# Patient Record
Sex: Male | Born: 2000 | Race: White | Hispanic: No | Marital: Single | Smoking: Current every day smoker
Health system: Southern US, Community
[De-identification: ages and names within clinical notes are randomized; demographics above are authoritative.]

## PROBLEM LIST (undated history)

## (undated) DIAGNOSIS — F419 Anxiety disorder, unspecified: Secondary | ICD-10-CM

## (undated) HISTORY — PX: TONSILLECTOMY: SUR1361

---
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
## 2023-05-19 ENCOUNTER — Emergency Department (HOSPITAL_COMMUNITY)
Admission: EM | Admit: 2023-05-19 | Discharge: 2023-05-19 | Disposition: A | Discharge status: Home / Self Care | Payer: Medicaid Other | Attending: Emergency Medicine | Admitting: Emergency Medicine

## 2023-05-19 ENCOUNTER — Other Ambulatory Visit: Payer: Self-pay

## 2023-05-19 DIAGNOSIS — T50901A Poisoning by unspecified drugs, medicaments and biological substances, accidental (unintentional), initial encounter: Secondary | ICD-10-CM

## 2023-05-19 DIAGNOSIS — R464 Slowness and poor responsiveness: Secondary | ICD-10-CM | POA: Insufficient documentation

## 2023-05-19 DIAGNOSIS — R7309 Other abnormal glucose: Secondary | ICD-10-CM | POA: Insufficient documentation

## 2023-05-19 DIAGNOSIS — M79676 Pain in unspecified toe(s): Secondary | ICD-10-CM | POA: Diagnosis not present

## 2023-05-19 DIAGNOSIS — R519 Headache, unspecified: Secondary | ICD-10-CM | POA: Diagnosis not present

## 2023-05-19 DIAGNOSIS — R4182 Altered mental status, unspecified: Secondary | ICD-10-CM | POA: Diagnosis present

## 2023-05-19 LAB — CBG MONITORING, ED: Glucose-Capillary: 107 mg/dL — ABNORMAL HIGH (ref 70–99)

## 2023-05-19 NOTE — Discharge Instructions (Signed)
Please avoid using any recreational drugs as it is harmful for your health.  Use resource below for outpatient care.

## 2023-05-19 NOTE — ED Provider Notes (Signed)
[place] EMERGENCY DEPARTMENT AT [facility] Provider Note   CSN: [id number] Arrival date & time: 05/19/23  2105     History  Chief Complaint  Patient presents with   Altered Mental Status    William Macdonald is a 22 y.o. male.  The history is provided by the patient and the EMS personnel. No language interpreter was used.  Altered Mental Status    22 year old male brought in to the ER via EMS for evaluation of altered mental status.  Patient was found unresponsive at an apartment and when found, he was having agonal breathing.  Patient received 2 mg of Narcan and became more responsive however he was noted to be hypoxic with O2 sats in the 80s requiring supplemental oxygen.  Patient brought to the ER and became much more responsive.  At this time he is doing much better.  Patient admits that he may have intentionally overdosed on drugs.  States that he was smoking marijuana that was given to him by another person which he felt may have been laced with something.  He denies intentionally tried to harm himself.  He denies IV drug use.  Aside from some mild headache and some toe pain.  Denies any active chest pain or shortness of breath denies abdominal pain no trouble with hallucination.  Home Medications Prior to Admission medications   Not on File      Allergies    Patient has no allergy information on record.    Review of Systems   Review of Systems  All other systems reviewed and are negative.   Physical Exam Updated Vital Signs BP (!) 123/91 (BP Location: Right Arm)   Temp 97.7 F (36.5 C) (Oral)   Resp 18   SpO2 (!) 82% Comment: patient placed on  2L Physical Exam Vitals and nursing note reviewed.  Constitutional:      General: He is not in acute distress.    Appearance: He is well-developed.     Comments: Disheveled in appearance however in no acute discomfort.  HENT:     Head: Atraumatic.  Eyes:     Conjunctiva/sclera: Conjunctivae normal.   Cardiovascular:     Rate and Rhythm: Normal rate and regular rhythm.     Pulses: Normal pulses.     Heart sounds: Normal heart sounds.  Pulmonary:     Effort: Pulmonary effort is normal.     Breath sounds: Normal breath sounds.  Abdominal:     Palpations: Abdomen is soft.  Musculoskeletal:     Cervical back: Neck supple.  Skin:    Findings: No rash.  Neurological:     Mental Status: He is alert. Mental status is at baseline.  Psychiatric:        Mood and Affect: Mood normal.     ED Results / Procedures / Treatments   Labs (all labs ordered are listed, but only abnormal results are displayed) Labs Reviewed  CBG MONITORING, ED - Abnormal; Notable for the following components:      Result Value   Glucose-Capillary 107 (*)    All other components within normal limits    EKG None  Radiology No results found.  Procedures Procedures    Medications Ordered in ED Medications - No data to display  ED Course/ Medical Decision Making/ A&P                             Medical Decision Making  BP (!) 123/91 (BP Location: Right Arm)   Temp 97.7 F (36.5 C) (Oral)   Resp 18   SpO2 (!) 82% Comment: patient placed on [place] 2L  24:20 PM  22 year old male brought in to the ER via EMS for evaluation of altered mental status.  Patient was found unresponsive at an apartment and when found, he was having agonal breathing.  Patient received 2 mg of Narcan and became more responsive however he was noted to be hypoxic with O2 sats in the 80s requiring supplemental oxygen.  Patient brought to the ER and became much more responsive.  At this time he is doing much better.  Patient admits that he may have intentionally overdosed on drugs.  States that he was smoking marijuana that was given to him by another person which he felt may have been laced with something.  He denies intentionally tried to harm himself.  He denies IV drug use.  Aside from some mild headache and some toe pain.  Denies  any active chest pain or shortness of breath denies abdominal pain no trouble with hallucination.  Initially when patient brought and, his O2 sat was at 82% however after receiving Narcan from EMS, he became a lot more alert and oriented.  He went to the bathroom to shower and my examination, he is now resting comfortably in the chair appears to be in no acute discomfort.  Head is atraumatic and normocephalic, lungs are clear to auscultation bilaterally abdomen soft nontender, patient is moving all 4 extremities with equal effort.  He is alert and oriented  Patient appears more towards his baseline.  Mentating appropriately.  He admits he intentionally used marijuana that may have been laced with other drugs.  Suspect fentanyl.  Denies intentional self-harm.  Vital sign recheck and O2 sats at 97% on room air.  11:13 PM Patient has been monitored for the past several hours with improvement of his symptoms.  At this time he is stable for discharge.        Final Clinical Impression(s) / ED Diagnoses Final diagnoses:  Accidental overdose, initial encounter    Rx / DC Orders ED Discharge Orders     None         Fayrene Helper, PA-C 05/19/23 2314    Glyn Ade, MD 05/20/23 1452

## 2023-05-19 NOTE — ED Triage Notes (Signed)
Patient was found unresponsive at some apartments off of [address], agonal breathing 4 breaths/minute upon EMS arrival, patient received Narcan 2mg  IN, patient was responsive after Narcan O2 remained in the 80s and he required O2 via [place]  at 2L

## 2023-07-08 ENCOUNTER — Emergency Department (HOSPITAL_COMMUNITY)
Admission: EM | Admit: 2023-07-08 | Discharge: 2023-07-08 | Disposition: A | Discharge status: Home / Self Care | Payer: Medicaid Other | Attending: Emergency Medicine | Admitting: Emergency Medicine

## 2023-07-08 DIAGNOSIS — M79672 Pain in left foot: Secondary | ICD-10-CM | POA: Diagnosis not present

## 2023-07-08 DIAGNOSIS — M79671 Pain in right foot: Secondary | ICD-10-CM | POA: Insufficient documentation

## 2023-07-08 MED ORDER — IBUPROFEN 200 MG PO TABS
600.0000 mg | ORAL_TABLET | Freq: Once | ORAL | Status: AC
  Administered 2023-07-08: 600 mg via ORAL
  Filled 2023-07-08: qty 3

## 2023-07-08 MED ORDER — CEPHALEXIN 500 MG PO CAPS
500.0000 mg | ORAL_CAPSULE | Freq: Once | ORAL | Status: AC
  Administered 2023-07-08: 500 mg via ORAL
  Filled 2023-07-08: qty 1

## 2023-07-08 MED ORDER — CEPHALEXIN 500 MG PO CAPS: 500.0000 mg | ORAL_CAPSULE | Freq: Four times a day (QID) | ORAL | 0 refills | Status: AC

## 2023-07-08 NOTE — Discharge Instructions (Addendum)
Your exam was reassuring.  Continue taking Tylenol and ibuprofen as you need to for pain control.  I have given you a prescription for antibiotics.  Follow-up with your primary care provider.

## 2023-07-08 NOTE — ED Provider Notes (Signed)
[place] EMERGENCY DEPARTMENT AT [facility] Provider Note   CSN: [id number] Arrival date & time: 07/08/23  4098     History  Chief Complaint  Patient presents with   Foot Pain    Pt reports maggots in feet, EMS report on assessment pt had no open wounds or maggots,  but notes irritation.       William Macdonald is a 22 y.o. male.  22 year old homeless male presents with EMS for concern of bilateral foot pain.  He states he has been walking a lot over the past few days.  Has been wearing shoes.  Denies any fever or drainage.  His feet are red.  No open wounds.  Reported maggots to EMS however the maggots on either feet.  He is requesting food and drink.  States he has not had any good sleep in the past few nights either.  Denies SI, or HI.  The history is provided by the patient. No language interpreter was used.       Home Medications Prior to Admission medications   Medication Sig Start Date End Date Taking? Authorizing Provider  cephALEXin (KEFLEX) 500 MG capsule Take 1 capsule (500 mg total) by mouth 4 (four) times daily for 7 days. 07/08/23 07/15/23 Yes Riki Berninger, PA-C      Allergies    Zyprexa [olanzapine]    Review of Systems   Review of Systems  Constitutional:  Negative for fever.  Skin:  Positive for wound.  All other systems reviewed and are negative.   Physical Exam Updated Vital Signs BP 135/60 (BP Location: Right Arm)   Pulse 81   Temp 97.9 F (36.6 C) (Oral)   Resp 18   SpO2 98%  Physical Exam Vitals and nursing note reviewed.  Constitutional:      General: He is not in acute distress.    Appearance: Normal appearance. He is not ill-appearing.  HENT:     Head: Normocephalic and atraumatic.     Nose: Nose normal.  Eyes:     Conjunctiva/sclera: Conjunctivae normal.  Pulmonary:     Effort: Pulmonary effort is normal. No respiratory distress.  Musculoskeletal:        General: No deformity.     Comments: Full range of motion in  bilateral lower extremities including in bilateral hips, knees, as well as ankles.  Erythema noted to bilateral feet.  No open sores or wounds.  No maggots.  Neurovascularly intact.  Patient ambulated in the room as well as to the bathroom without any difficulty.  Skin:    Findings: No rash.  Neurological:     Mental Status: He is alert.     ED Results / Procedures / Treatments   Labs (all labs ordered are listed, but only abnormal results are displayed) Labs Reviewed - No data to display  EKG None  Radiology No results found.  Procedures Procedures    Medications Ordered in ED Medications  cephALEXin (KEFLEX) capsule 500 mg (500 mg Oral Given 07/08/23 0335)  ibuprofen (ADVIL) tablet 600 mg (600 mg Oral Given 07/08/23 0335)    ED Course/ Medical Decision Making/ A&P                             Medical Decision Making Risk OTC drugs. Prescription drug management.   22 year old male presented with concern for bilateral foot pain after walking quite a bit over the past few days.  He is homeless.  Weather has been extremely hot.  He was wearing sneakers.  Exam is overall reassuring.  Neurovascularly intact.  Keflex prescribed.  Initial dose given in the ED.  He is requesting food.  No other complaints.  He is appropriate for discharge.  Denies SI or HI.   Final Clinical Impression(s) / ED Diagnoses Final diagnoses:  Bilateral foot pain    Rx / DC Orders ED Discharge Orders          Ordered    cephALEXin (KEFLEX) 500 MG capsule  4 times daily        07/08/23 0554              Marita Kansas, PA-C 07/08/23 0556    Tilden Fossa, MD 07/08/23 [phone]

## 2023-09-24 ENCOUNTER — Emergency Department
Admission: EM | Admit: 2023-09-24 | Discharge: 2023-09-24 | Disposition: A | Discharge status: Home / Self Care | Payer: Medicaid Other | Attending: Emergency Medicine | Admitting: Emergency Medicine

## 2023-09-24 ENCOUNTER — Other Ambulatory Visit: Payer: Self-pay

## 2023-09-24 DIAGNOSIS — F10921 Alcohol use, unspecified with intoxication delirium: Secondary | ICD-10-CM | POA: Diagnosis not present

## 2023-09-24 DIAGNOSIS — R4182 Altered mental status, unspecified: Secondary | ICD-10-CM | POA: Diagnosis present

## 2023-09-24 DIAGNOSIS — Y908 Blood alcohol level of 240 mg/100 ml or more: Secondary | ICD-10-CM | POA: Diagnosis not present

## 2023-09-24 LAB — COMPREHENSIVE METABOLIC PANEL
ALT: 11 U/L (ref 0–44)
AST: 23 U/L (ref 15–41)
Albumin: 3.5 g/dL (ref 3.5–5.0)
Alkaline Phosphatase: 125 U/L (ref 38–126)
Anion gap: 12 (ref 5–15)
BUN: 8 mg/dL (ref 6–20)
CO2: 20 mmol/L — ABNORMAL LOW (ref 22–32)
Calcium: 8 mg/dL — ABNORMAL LOW (ref 8.9–10.3)
Chloride: 102 mmol/L (ref 98–111)
Creatinine, Ser: 0.53 mg/dL — ABNORMAL LOW (ref 0.61–1.24)
GFR, Estimated: >60 mL/min (ref >60)
Glucose, Bld: 122 mg/dL — ABNORMAL HIGH (ref 70–99)
Potassium: 3.7 mmol/L (ref 3.5–5.1)
Sodium: 134 mmol/L — ABNORMAL LOW (ref 135–145)
Total Bilirubin: 0.6 mg/dL (ref 0.3–1.2)
Total Protein: 6.1 g/dL — ABNORMAL LOW (ref 6.5–8.1)

## 2023-09-24 LAB — URINE DRUG SCREEN, QUALITATIVE (ARMC ONLY)
Amphetamines, Ur Screen: NOT DETECTED
Barbiturates, Ur Screen: NOT DETECTED
Benzodiazepine, Ur Scrn: NOT DETECTED
Cannabinoid 50 Ng, Ur ~~LOC~~: POSITIVE — AB
Cocaine Metabolite,Ur ~~LOC~~: NOT DETECTED
MDMA (Ecstasy)Ur Screen: NOT DETECTED
Methadone Scn, Ur: NOT DETECTED
Opiate, Ur Screen: NOT DETECTED
Phencyclidine (PCP) Ur S: NOT DETECTED
Tricyclic, Ur Screen: NOT DETECTED

## 2023-09-24 LAB — CBC
HCT: 39.6 % (ref 39.0–52.0)
Hemoglobin: 13.1 g/dL (ref 13.0–17.0)
MCH: 28.3 pg (ref 26.0–34.0)
MCHC: 33.1 g/dL (ref 30.0–36.0)
MCV: 85.5 fL (ref 80.0–100.0)
Platelets: 194 10*3/uL (ref 150–400)
RBC: 4.63 MIL/uL (ref 4.22–5.81)
RDW: 13.4 % (ref 11.5–15.5)
WBC: 6.8 10*3/uL (ref 4.0–10.5)
nRBC: 0 % (ref 0.0–0.2)

## 2023-09-24 LAB — ACETAMINOPHEN LEVEL: Acetaminophen (Tylenol), Serum: <10 ug/mL — ABNORMAL LOW (ref 10–30)

## 2023-09-24 LAB — ETHANOL: Alcohol, Ethyl (B): 285 mg/dL — ABNORMAL HIGH (ref <10)

## 2023-09-24 LAB — SALICYLATE LEVEL: Salicylate Lvl: <7 mg/dL — ABNORMAL LOW (ref 7.0–30.0)

## 2023-09-24 MED ORDER — SODIUM CHLORIDE 0.9 % IV BOLUS
1000.0000 mL | Freq: Once | INTRAVENOUS | Status: AC
  Administered 2023-09-24: 1000 mL via INTRAVENOUS

## 2023-09-24 NOTE — ED Provider Notes (Signed)
-----------------------------------------   8:58 PM on 09/24/2023 -----------------------------------------  I took over care of this patient from Dr. Vicente Males.  On reassessment, the patient is alert and oriented, walking with steady gait, clinically sober.  He does endorse alcohol use earlier.  UDS is positive only for cannabinol's.  Lab workup is unremarkable.  The patient is stable for discharge home at this time.  Return precautions given, and he expresses understanding.   Dionne Bucy, MD 09/24/23 [phone]

## 2023-09-24 NOTE — ED Notes (Signed)
Patient awake, alert oriented, walking without difficulty, siding at the bedside eating a lunch tray

## 2023-09-24 NOTE — ED Triage Notes (Signed)
Pt arrived via EMS. Pt is having altered mental status. Pt was found near a public bathroom covered in vomit. Pt smells of ETOH. Pt is A/Ox1 at this time.

## 2023-09-24 NOTE — ED Provider Notes (Signed)
[facility] Provider Note   Event Date/Time   First MD Initiated Contact with Patient 09/24/23 1336     (approximate) History  Altered Mental Status  HPI William Macdonald is a 22 y.o. male with unknown past medical history who presents via EMS for altered mental status after being found down near a public restroom covered in vomit.  EMS notes patient smells of EtOH and is only alert and oriented to self at this time ROS: Unable to assess   Physical Exam  Triage Vital Signs: ED Triage Vitals  Encounter Vitals Group     BP 09/24/23 1338 112/64     Systolic BP Percentile --      Diastolic BP Percentile --      Pulse Rate 09/24/23 1338 (!) 59     Resp 09/24/23 1338 14     Temp 09/24/23 1338 98.4 F (36.9 C)     Temp Source 09/24/23 1338 Oral     SpO2 09/24/23 1338 100 %     Weight 09/24/23 1335 151 lb (68.5 kg)     Height --      Head Circumference --      Peak Flow --      Pain Score 09/24/23 1334 0     Pain Loc --      Pain Education --      Exclude from Growth Chart --    Most recent vital signs: Vitals:   09/24/23 1338  BP: 112/64  Pulse: (!) 59  Resp: 14  Temp: 98.4 F (36.9 C)  SpO2: 100%   General: Asleep on stretcher and awakened with sternal rub CV:  Good peripheral perfusion.  Resp:  Normal effort.  Abd:  No distention.  Other:  Young adult well-developed, well-nourished Caucasian male resting in stretcher.  GCS 9  ED Results / Procedures / Treatments  Labs (all labs ordered are listed, but only abnormal results are displayed) Labs Reviewed  COMPREHENSIVE METABOLIC PANEL - Abnormal; Notable for the following components:      Result Value   Sodium 134 (*)    CO2 20 (*)    Glucose, Bld 122 (*)    Creatinine, Ser 0.53 (*)    Calcium 8.0 (*)    Total Protein 6.1 (*)    All other components within normal limits  ETHANOL - Abnormal; Notable for the following components:   Alcohol, Ethyl (B) 285 (*)    All other components within  normal limits  SALICYLATE LEVEL - Abnormal; Notable for the following components:   Salicylate Lvl <7.0 (*)    All other components within normal limits  ACETAMINOPHEN LEVEL - Abnormal; Notable for the following components:   Acetaminophen (Tylenol), Serum <10 (*)    All other components within normal limits  CBC  URINE DRUG SCREEN, QUALITATIVE ([facility] ONLY)   EKG ED ECG REPORT I, Merwyn Katos, the attending physician, personally viewed and interpreted this ECG. Date: 09/24/2023 EKG Time: 1343 Rate: 56 Rhythm: normal sinus rhythm QRS Axis: normal Intervals: normal ST/T Wave abnormalities: normal Narrative Interpretation: no evidence of acute ischemia PROCEDURES: Critical Care performed: No Procedures MEDICATIONS ORDERED IN ED: Medications  sodium chloride 0.9 % bolus 1,000 mL (1,000 mLs Intravenous New Bag/Given 09/24/23 1350)   IMPRESSION / MDM / ASSESSMENT AND PLAN / ED COURSE  I reviewed the triage vital signs and the nursing notes.  The patient is on the cardiac monitor to evaluate for evidence of arrhythmia and/or significant heart rate changes. Patient's presentation is most consistent with acute presentation with potential threat to life or bodily function. Presents with altered mental status. +Slurred, sluggish behavior. Stated EtOH intoxication. Airway maintained. Unlikely intracranial bleed, opioid intoxication or coingestion, sepsis, hypothyroidism. Suspect likely transient course of intoxication with expected  improvement of symptoms as patient metabolizes offending agent.  Plan: frequent reassessments  Disposition: Care of this patient will be signed out to the oncoming physician at the end of my shift.  All pertinent patient information conveyed and all questions answered.  All further care and disposition decisions will be made by the oncoming physician.    FINAL CLINICAL IMPRESSION(S) / ED DIAGNOSES   Final diagnoses:  Altered  mental status, unspecified altered mental status type  Alcohol intoxication with delirium (HCC)   Rx / DC Orders   ED Discharge Orders     None      Note:  This document was prepared using Dragon voice recognition software and may include unintentional dictation errors.   Merwyn Katos, MD 09/24/23 [phone]

## 2023-10-03 ENCOUNTER — Other Ambulatory Visit: Payer: Self-pay

## 2023-10-03 ENCOUNTER — Emergency Department (HOSPITAL_COMMUNITY)
Admission: EM | Admit: 2023-10-03 | Discharge: 2023-10-03 | Disposition: A | Discharge status: Home / Self Care | Payer: Medicaid Other | Attending: Emergency Medicine | Admitting: Emergency Medicine

## 2023-10-03 ENCOUNTER — Encounter (HOSPITAL_COMMUNITY): Payer: Self-pay | Admitting: Emergency Medicine

## 2023-10-03 DIAGNOSIS — F1910 Other psychoactive substance abuse, uncomplicated: Secondary | ICD-10-CM | POA: Diagnosis not present

## 2023-10-03 DIAGNOSIS — R1030 Lower abdominal pain, unspecified: Secondary | ICD-10-CM | POA: Insufficient documentation

## 2023-10-03 DIAGNOSIS — Z79899 Other long term (current) drug therapy: Secondary | ICD-10-CM | POA: Insufficient documentation

## 2023-10-03 DIAGNOSIS — R102 Pelvic and perineal pain: Secondary | ICD-10-CM

## 2023-10-03 LAB — RAPID URINE DRUG SCREEN, HOSP PERFORMED
Amphetamines: NOT DETECTED
Barbiturates: NOT DETECTED
Benzodiazepines: NOT DETECTED
Cocaine: POSITIVE — AB
Opiates: POSITIVE — AB
Tetrahydrocannabinol: POSITIVE — AB

## 2023-10-03 LAB — URINALYSIS, ROUTINE W REFLEX MICROSCOPIC
Bilirubin Urine: NEGATIVE
Glucose, UA: NEGATIVE mg/dL
Hgb urine dipstick: NEGATIVE
Ketones, ur: 20 mg/dL — AB
Leukocytes,Ua: NEGATIVE
Nitrite: NEGATIVE
Protein, ur: NEGATIVE mg/dL
Specific Gravity, Urine: 1.014 (ref 1.005–1.030)
pH: 5 (ref 5.0–8.0)

## 2023-10-03 NOTE — Discharge Instructions (Signed)
You have been seen today for your complaint of bladder pain. Your lab work showed no evidence of infection on your urine. Follow up with: A substance abuse center.  I have included a resource guides, you may contact any of these facilities Please seek immediate medical care if you develop any of the following symptoms: New or worsening symptoms At this time there does not appear to be the presence of an emergent medical condition, however there is always the potential for conditions to change. Please read and follow the below instructions.  Do not take your medicine if  develop an itchy rash, swelling in your mouth or lips, or difficulty breathing; call 911 and seek immediate emergency medical attention if this occurs.  You may review your lab tests and imaging results in their entirety on your MyChart account.  Please discuss all results of fully with your primary care provider and other specialist at your follow-up visit.  Note: Portions of this text may have been transcribed using voice recognition software. Every effort was made to ensure accuracy; however, inadvertent computerized transcription errors may still be present.

## 2023-10-03 NOTE — ED Provider Notes (Signed)
[place] EMERGENCY DEPARTMENT AT [facility] Provider Note   CSN: [id number] Arrival date & time: 10/03/23  6578     History  Chief Complaint  Patient presents with   bladder pain    William Macdonald is a 22 y.o. male.  With a history of polysubstance abuse presenting to the ED for evaluation of lower abdominal pain.  He states he woke up this morning approximately 1 hour prior to arrival with suprapubic abdominal pain in the area of his bladder.  He believes this is due to him drinking too 40 ounce beers prior to going to sleep last night.  He states he urinated and the symptoms then went away.  He is currently asymptomatic.  He denies any dysuria, frequency, urgency, penile or testicle pain.  He is sexually active with many male partners and does not use protection.  He would like to be tested for STIs today.  He also endorses using heroin.  He inquires about rehab programs here in the emergency department.  He denies SI, HI, AVH.  HPI     Home Medications Prior to Admission medications   Not on File      Allergies    Zyprexa [olanzapine]    Review of Systems   Review of Systems  Gastrointestinal:  Positive for abdominal pain.  All other systems reviewed and are negative.   Physical Exam Updated Vital Signs BP 132/88   Pulse 62   Temp (!) 97.5 F (36.4 C) (Oral)   Resp 17   Ht 6' (1.829 m)   Wt 63.5 kg   SpO2 100%   BMI 18.99 kg/m  Physical Exam Vitals and nursing note reviewed.  Constitutional:      General: He is not in acute distress.    Appearance: He is well-developed.     Comments: Resting comfortably in bed  HENT:     Head: Normocephalic and atraumatic.  Eyes:     Conjunctiva/sclera: Conjunctivae normal.  Cardiovascular:     Rate and Rhythm: Normal rate and regular rhythm.     Heart sounds: No murmur heard. Pulmonary:     Effort: Pulmonary effort is normal. No respiratory distress.     Breath sounds: Normal breath sounds. No  wheezing, rhonchi or rales.  Abdominal:     General: There is no distension.     Palpations: Abdomen is soft.     Tenderness: There is no abdominal tenderness. There is no guarding or rebound.  Genitourinary:    Comments: Patient declined GU exam Musculoskeletal:        General: No swelling.     Cervical back: Neck supple.  Skin:    General: Skin is warm and dry.     Capillary Refill: Capillary refill takes less than 2 seconds.  Neurological:     Mental Status: He is alert.  Psychiatric:        Mood and Affect: Mood normal.     ED Results / Procedures / Treatments   Labs (all labs ordered are listed, but only abnormal results are displayed) Labs Reviewed  URINALYSIS, ROUTINE W REFLEX MICROSCOPIC - Abnormal; Notable for the following components:      Result Value   Ketones, ur 20 (*)    All other components within normal limits  RAPID URINE DRUG SCREEN, HOSP PERFORMED - Abnormal; Notable for the following components:   Opiates POSITIVE (*)    Cocaine POSITIVE (*)    Tetrahydrocannabinol POSITIVE (*)    All other  components within normal limits  GC/CHLAMYDIA PROBE AMP ([place]) NOT AT [facility]    EKG None  Radiology No results found.  Procedures Procedures    Medications Ordered in ED Medications - No data to display  ED Course/ Medical Decision Making/ A&P Clinical Course as of 10/03/23 6045  [facility] Oct 03, 2023  4098 Bladder scan with 29 mL urine [AS]    Clinical Course User Index [AS] Michelle Piper, PA-C                                 Medical Decision Making Amount and/or Complexity of Data Reviewed Labs: ordered.   This patient presents to the ED for concern of suprapubic pain, this involves an extensive number of treatment options, and is a complaint that carries with it a high risk of complications and morbidity. The differential diagnosis for generalized abdominal pain includes, but is not limited to AAA, gastroenteritis, appendicitis, Bowel  obstruction, Bowel perforation. Gastroparesis, DKA, Hernia, Inflammatory bowel disease, mesenteric ischemia, pancreatitis, peritonitis SBP, volvulus.   My initial workup includes urinalysis  Additional history obtained from: Nursing notes from this visit.  I ordered, reviewed and interpreted labs which include: Urinalysis, UDS, urine chlamydia and gonorrhea  Afebrile, hemodynamically stable.  22 year old male presenting for evaluation of some suprapubic pain.  He believes this pain is secondary to his alcohol use just prior to going to sleep last night.  He states he urinated prior to arrival and the pain went away.  He denies any dysuria, frequency, urgency, hematuria.  Currently denies any abdominal pain or other symptoms at all.  Bladder scan revealed an empty bladder.  Urine without evidence of infection.  He is agreeable to STI testing via urine.  He declines GU exam in the emergency department but denies any testicle pain, penile pain or rashes.  Does have a history of herpes but is not in an acute flare.  Patient immediately began asking for food on my initial evaluation.  Overall suspect a portion of his visit is for secondary gain.  He was also requesting information regarding rehab centers as he uses heroin and alcohol daily and would like to stop.  He was given resource guides for both outpatient and residential rehab centers.  He was given return precautions.  Stable at discharge.  At this time there does not appear to be any evidence of an acute emergency medical condition and the patient appears stable for discharge with appropriate outpatient follow up. Diagnosis was discussed with patient who verbalizes understanding of care plan and is agreeable to discharge. I have discussed return precautions with patient who verbalizes understanding. Patient encouraged to follow-up with their PCP within 1 week. All questions answered.  Note: Portions of this report may have been transcribed using  voice recognition software. Every effort was made to ensure accuracy; however, inadvertent computerized transcription errors may still be present.        Final Clinical Impression(s) / ED Diagnoses Final diagnoses:  Suprapubic pain  Polysubstance abuse [facility])    Rx / DC Orders ED Discharge Orders     None         Michelle Piper, PA-C 10/03/23 0828    Rexford Maus, DO 10/03/23 [phone]

## 2023-10-03 NOTE — ED Triage Notes (Signed)
Pt. Stated, Im having bladder pain from drinking too many beers. I had 2 40's. I had heroin and cocaine the night before. Denies any N/V or any burning on urination. Pt ask do we do help for drug use.

## 2023-10-04 ENCOUNTER — Encounter (HOSPITAL_COMMUNITY): Payer: Self-pay

## 2023-10-04 ENCOUNTER — Emergency Department (HOSPITAL_COMMUNITY)
Admission: EM | Admit: 2023-10-04 | Discharge: 2023-10-04 | Disposition: A | Discharge status: Home / Self Care | Payer: Medicaid Other | Attending: Emergency Medicine | Admitting: Emergency Medicine

## 2023-10-04 DIAGNOSIS — F29 Unspecified psychosis not due to a substance or known physiological condition: Secondary | ICD-10-CM | POA: Diagnosis not present

## 2023-10-04 DIAGNOSIS — F419 Anxiety disorder, unspecified: Secondary | ICD-10-CM | POA: Insufficient documentation

## 2023-10-04 DIAGNOSIS — F19959 Other psychoactive substance use, unspecified with psychoactive substance-induced psychotic disorder, unspecified: Secondary | ICD-10-CM | POA: Diagnosis present

## 2023-10-04 DIAGNOSIS — F23 Brief psychotic disorder: Secondary | ICD-10-CM | POA: Diagnosis present

## 2023-10-04 DIAGNOSIS — Z79899 Other long term (current) drug therapy: Secondary | ICD-10-CM | POA: Insufficient documentation

## 2023-10-04 DIAGNOSIS — R519 Headache, unspecified: Secondary | ICD-10-CM | POA: Insufficient documentation

## 2023-10-04 DIAGNOSIS — R442 Other hallucinations: Secondary | ICD-10-CM

## 2023-10-04 LAB — COMPREHENSIVE METABOLIC PANEL
ALT: 13 U/L (ref 0–44)
AST: 25 U/L (ref 15–41)
Albumin: 4.3 g/dL (ref 3.5–5.0)
Alkaline Phosphatase: 136 U/L — ABNORMAL HIGH (ref 38–126)
Anion gap: 9 (ref 5–15)
BUN: 13 mg/dL (ref 6–20)
CO2: 23 mmol/L (ref 22–32)
Calcium: 8.6 mg/dL — ABNORMAL LOW (ref 8.9–10.3)
Chloride: 103 mmol/L (ref 98–111)
Creatinine, Ser: 0.72 mg/dL (ref 0.61–1.24)
GFR, Estimated: >60 mL/min (ref >60)
Glucose, Bld: 108 mg/dL — ABNORMAL HIGH (ref 70–99)
Potassium: 3.8 mmol/L (ref 3.5–5.1)
Sodium: 135 mmol/L (ref 135–145)
Total Bilirubin: 0.6 mg/dL (ref 0.3–1.2)
Total Protein: 7.8 g/dL (ref 6.5–8.1)

## 2023-10-04 LAB — RAPID URINE DRUG SCREEN, HOSP PERFORMED
Amphetamines: NOT DETECTED
Barbiturates: NOT DETECTED
Benzodiazepines: NOT DETECTED
Cocaine: POSITIVE — AB
Opiates: POSITIVE — AB
Tetrahydrocannabinol: POSITIVE — AB

## 2023-10-04 LAB — CBC WITH DIFFERENTIAL/PLATELET
Abs Immature Granulocytes: 0.02 10*3/uL (ref 0.00–0.07)
Basophils Absolute: 0 10*3/uL (ref 0.0–0.1)
Basophils Relative: 0 %
Eosinophils Absolute: 0.1 10*3/uL (ref 0.0–0.5)
Eosinophils Relative: 1 %
HCT: 40.1 % (ref 39.0–52.0)
Hemoglobin: 13.4 g/dL (ref 13.0–17.0)
Immature Granulocytes: 0 %
Lymphocytes Relative: 29 %
Lymphs Abs: 2.1 10*3/uL (ref 0.7–4.0)
MCH: 28.6 pg (ref 26.0–34.0)
MCHC: 33.4 g/dL (ref 30.0–36.0)
MCV: 85.7 fL (ref 80.0–100.0)
Monocytes Absolute: 0.6 10*3/uL (ref 0.1–1.0)
Monocytes Relative: 8 %
Neutro Abs: 4.5 10*3/uL (ref 1.7–7.7)
Neutrophils Relative %: 62 %
Platelets: 260 10*3/uL (ref 150–400)
RBC: 4.68 MIL/uL (ref 4.22–5.81)
RDW: 14.1 % (ref 11.5–15.5)
WBC: 7.3 10*3/uL (ref 4.0–10.5)
nRBC: 0 % (ref 0.0–0.2)

## 2023-10-04 LAB — ACETAMINOPHEN LEVEL: Acetaminophen (Tylenol), Serum: <10 ug/mL — ABNORMAL LOW (ref 10–30)

## 2023-10-04 LAB — SALICYLATE LEVEL: Salicylate Lvl: <7 mg/dL — ABNORMAL LOW (ref 7.0–30.0)

## 2023-10-04 LAB — ETHANOL: Alcohol, Ethyl (B): <10 mg/dL (ref <10)

## 2023-10-04 MED ORDER — ACYCLOVIR 400 MG PO TABS: 400.0000 mg | ORAL_TABLET | Freq: Two times a day (BID) | ORAL | 0 refills | Status: DC

## 2023-10-04 MED ORDER — FLUCONAZOLE 200 MG PO TABS
200.0000 mg | ORAL_TABLET | Freq: Every day | ORAL | Status: DC
  Administered 2023-10-04: 200 mg via ORAL
  Filled 2023-10-04: qty 1

## 2023-10-04 MED ORDER — ACYCLOVIR 400 MG PO TABS: 400.0000 mg | ORAL_TABLET | Freq: Two times a day (BID) | ORAL | 0 refills | Status: AC

## 2023-10-04 MED ORDER — ACYCLOVIR 400 MG PO TABS
400.0000 mg | ORAL_TABLET | Freq: Two times a day (BID) | ORAL | Status: DC
  Administered 2023-10-04: 400 mg via ORAL
  Filled 2023-10-04: qty 1

## 2023-10-04 NOTE — ED Notes (Signed)
[facility] spoke with pts mother William Macdonald) for collateral. Pts mother said that pt has a history of drug and alcohol use, depression and homelessness. Pt was recently released from jail after serving time for a stealing from [place]. Pt was at [facility] 6 years ago for treatment for depression when he was an adolescent which went well. Pt was taking medication and attending therapy. Pt has been diagnosed with depression and ADHD. Pts mother said that pt also went to treatment 2 years ago.   Pts mother said that pt has had a difficult childhood which has contributed to his drug use and mental health issues. Since pts last treatment he has been in and out of jail and actively using drugs and alcohol. Pts mother said that he cannot return to live with her due to his behavior but expressed concern for pt and hopes that he will get the treatment he needs.   [name], [facility]  10/04/23

## 2023-10-04 NOTE — Discharge Summary (Addendum)
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 Arrives via [facility] c/o worms in his blood stream. Pt. States that they are hurting in his head and all over his body. Denies alcohol tonight. States that he smoked marijuana last night. Denies SI/HI" Patient was seen this morning and awoken by provider.  He woke up and participated in meaningful conversation.  He admits to getting in trouble and going to jail for drug use.  Some questions he referred provider to read his old record.  Patient states he is homeless, has no family and friends.  He states he is employed at a [employer].  He denied previous mental health diagnosis and care.   Patient denies warm crawling allover him but asked Provider to check to make sure he has no worm in his blood.  Lab results are are all WNL except for Alkaline Phosphate of 136.  UDS is positive for  Opiates, Cocaine  and Cannabis.  Alcohol is Negative however he reports drinking Alcohol every other day.  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 Collateral information from mother  William Macdonald is that as a child patient was diagnosed with ADHD and Depression.  She added that patient has been in and out of jail for substance abuse and have not received any treatment for substance abuse.  She added that patient is not welcomed by any family member due to his life style.  He recently spent time in jail for stealing at [facility]. Patient does not want treatments, he asked for Bus ticket because he .want to go to work.  Patient also asked to be allowed to eat Lunch before leaving.  We reviewed safety plans-call 911 or [phone]  mental health crisis, go to [facility] facility for Mental health care.  Discharge was reviewed with DR Massengill, Psychiatrist who is in agreement for discharge.  Resources for community services including Shelters and substance abuse care are given to patient.  He denied SI/HI/AVH and he is Psychiatrically cleared.   ED Assessment Time Calculation: Start Time: 1208 Stop Time: 1240 Total Time in Minutes (Assessment Completion): 32   Past Psychiatric History: ADHD, Depression.  No outpatient Mental health care.  Not taking any Psychotropics. Per mother patient went for substance abuse treatment but does nmot know where and foe how long.  Past Medical History: History reviewed. No pertinent past medical history. History reviewed. No pertinent surgical history. Family History: History reviewed. No pertinent family history. Family Psychiatric  History: Unknown Social History:  Social History   Substance and Sexual Activity  Alcohol Use Yes     Social History   Substance and Sexual Activity  Drug Use Yes   Types: Cocaine   Comment: Heroin    Social History   Socioeconomic History   Marital status: Single    Spouse name: Not on file   Number of children: Not on file   Years of education: Not on file   Highest education level: Not on file  Occupational History   Not on file  Tobacco Use   Smoking status: Every Day    Types: Cigarettes    Passive exposure: Yes   Smokeless tobacco: Never  Substance and  Sexual Activity   Alcohol use: Yes   Drug use: Yes    Types: Cocaine    Comment: Heroin   Sexual activity: Not on file  Other Topics Concern   Not on file  Social History Narrative   Not on file   Social Determinants of Health   Financial Resource Strain: Not on file  Food Insecurity: Not on file  Transportation Needs: Not on file  Physical Activity: Not on file  Stress: Not on file  Social Connections: Unknown (04/27/2022)   Received from [facility]   Social Network    Social Network: Not on file    Tobacco Cessation:  N/A, patient does not currently use tobacco products  Current Medications: Current Facility-Administered Medications  Medication Dose Route Frequency Provider Last Rate Last Admin   acyclovir (ZOVIRAX) tablet 400 mg  400 mg Oral BID Barbi Kumagai C, NP   400 mg at 10/04/23 1242   fluconazole (DIFLUCAN) tablet 200 mg  200 mg Oral Daily Barrie Dunker B, PA-C   200 mg at 10/04/23 1242   Current Outpatient Medications  Medication Sig Dispense Refill   acyclovir (ZOVIRAX) 400 MG tablet Take 1 tablet (400 mg total) by mouth 2 (two) times daily. 16 tablet 0   acyclovir (ZOVIRAX) 400 MG tablet Take 1 tablet (400 mg total) by mouth 2 (two) times daily for 8 days. 16 tablet 0   cetirizine HCl (ZYRTEC) 1 MG/ML solution Take 5 mg by mouth daily. (Patient not taking: Reported on 10/04/2023)     fluconazole (DIFLUCAN) 200 MG tablet Take 200 mg by mouth daily. (Patient not taking: Reported on 10/04/2023)     PTA Medications: (Not in a hospital admission)   [place] Scale:  Flowsheet Row ED from 10/04/2023 in [facility] Emergency Department at [facility] ED from 10/03/2023 in [facility] Emergency Department at [facility] ED from 09/24/2023 in [facility] Emergency Department at [facility]  C-SSRS RISK CATEGORY No Risk No Risk No Risk       Musculoskeletal: Strength & Muscle Tone: within normal limits Gait & Station: normal Patient leans: Front  Psychiatric Specialty Exam: Presentation  General Appearance:  Casual  Eye Contact: Fleeting  Speech: Clear and Coherent; Normal Rate  Speech Volume: Normal  Handedness: Right   Mood and Affect  Mood: Euthymic  Affect: Congruent   Thought Process  Thought Processes: Coherent; Goal Directed  Descriptions of Associations:Intact  Orientation:Full (Time, Place and Person)  Thought Content:Logical  History  of Schizophrenia/Schizoaffective disorder:No data recorded Duration of Psychotic Symptoms:No data recorded Hallucinations:Hallucinations: None  Ideas of Reference:None  Suicidal Thoughts:Suicidal Thoughts: No  Homicidal Thoughts:Homicidal Thoughts: No   Sensorium  Memory: Immediate Good; Recent Good; Remote Good  Judgment: Intact  Insight: Fair   Art therapist  Concentration: Fair  Attention Span: Fair  Recall: Good  Fund of Knowledge: Good  Language: Good   Psychomotor Activity  Psychomotor Activity: Psychomotor Activity: Normal   Assets  Assets: Communication Skills; Desire for Improvement   Sleep  Sleep: Sleep: Fair    Physical Exam: Physical Exam Vitals and nursing note reviewed.  Constitutional:      Appearance: Normal appearance.  HENT:     Nose: Nose normal.  Cardiovascular:     Rate and Rhythm: Bradycardia present.  Pulmonary:     Effort: Pulmonary effort is normal.  Musculoskeletal:        General: Normal range of motion.     Cervical back: Normal  range of motion.  Skin:    General: Skin is dry.  Neurological:     Mental Status: He is alert and oriented to person, place, and time.  Psychiatric:        Attention and Perception: Attention and perception normal.        Mood and Affect: Mood normal.        Speech: Speech normal.        Behavior: Behavior is cooperative.        Thought Content: Thought content normal.        Cognition and Memory: Cognition and memory normal.    Review of Systems  Constitutional: Negative.   HENT: Negative.    Eyes: Negative.   Respiratory: Negative.    Cardiovascular: Negative.   Gastrointestinal: Negative.   Genitourinary: Negative.   Musculoskeletal: Negative.   Skin: Negative.   Neurological: Negative.   Endo/Heme/Allergies: Negative.   Psychiatric/Behavioral:  Positive for substance abuse. The patient has insomnia.    Blood pressure 126/80, pulse 92, temperature 98.2 F  (36.8 C), temperature source Oral, resp. rate 16, SpO2 99%. There is no height or weight on file to calculate BMI.   Demographic Factors:  Male, Adolescent or young adult, Caucasian, Low socioeconomic status, and homeless, employed he says  Loss Factors: NA  Historical Factors: NA  Risk Reduction Factors:   Employed and homeless.  Continued Clinical Symptoms:  Depression:   Impulsivity Insomnia Alcohol/Substance Abuse/Dependencies  Cognitive Features That Contribute To Risk:  None    Suicide Risk:  Minimal: No identifiable suicidal ideation.  Patients presenting with no risk factors but with morbid ruminations; may be classified as minimal risk based on the severity of the depressive symptoms    Plan Of Care/Follow-up recommendations:  Activity:  as tolerated Diet:  Regular  Medical Decision Making: Patient does not meet criteria for Mental health inpatient hospitalization as he is not ready to be treated for substance abuse.  Resources have been offered to him.  He denies SI/HI/AVH.  He has been educated on the need to avoid Accidental drug OD.  Patient is Psychiatrically cleared.  Problem 1: Substance induced Psychosis  Problem 2: Acute Brief Psychosis  Problem 3: Polysubstance abuse.  Disposition: Psychiatrically cleared. Earney Navy, NP--PMHNP-BC 10/04/2023, 1:16 PM

## 2023-10-04 NOTE — ED Triage Notes (Signed)
Pt. Arrives via [facility] c/o worms in his blood stream. Pt. States that they are hurting in his head and all over his body. Denies alcohol tonight. States that he smoked marijuana last night. Denies SI/HI.

## 2023-10-04 NOTE — ED Notes (Signed)
Pt resting with eyes closed, soft snoring resp at present time. Continue to observe and monitor

## 2023-10-04 NOTE — ED Provider Notes (Signed)
[place] EMERGENCY DEPARTMENT AT [facility] Provider Note   CSN: [id number] Arrival date & time: 10/04/23  0156     History  Chief Complaint  Patient presents with   Mental Health Problem    William Macdonald is a 22 y.o. male.  Patient presents to the emergency department complaining of widespread worm infection in his bloodstream.  He believes they are hurting his body.  He endorses pain in his head" all over".  The patient used marijuana last night and uses tobacco daily but denies alcohol usage.  Patient denies SI/HI.  Patient denies nausea, vomiting, chest pain, shortness of breath, abdominal pain, diarrhea, penile discharge.  Chart review shows history of accidental overdose on anxiety medications, opiates.  Also shows visits within the past week and a half for alcohol intoxication, fungal infection, genital herpes, and yesterday suprapubic pain.   Mental Health Problem      Home Medications Prior to Admission medications   Medication Sig Start Date End Date Taking? Authorizing Provider  acyclovir (ZOVIRAX) 400 MG tablet Take 400 mg by mouth 2 (two) times daily. 09/30/23 10/10/23 Yes [provider]  cetirizine HCl (ZYRTEC) 1 MG/ML solution Take 5 mg by mouth daily. Patient not taking: Reported on 10/04/2023 09/30/23   [provider]  fluconazole (DIFLUCAN) 200 MG tablet Take 200 mg by mouth daily. Patient not taking: Reported on 10/04/2023 09/30/23 10/14/23  [provider]      Allergies    Zyprexa [olanzapine]    Review of Systems   Review of Systems  Physical Exam Updated Vital Signs BP 118/61   Pulse 73   Temp 98.2 F (36.8 C) (Oral)   Resp 18   SpO2 99%  Physical Exam Vitals and nursing note reviewed.  HENT:     Head: Normocephalic and atraumatic.  Eyes:     Pupils: Pupils are equal, round, and reactive to light.  Cardiovascular:     Rate and Rhythm: Normal rate.  Pulmonary:     Effort: Pulmonary effort is normal.  No respiratory distress.  Musculoskeletal:        General: No signs of injury.     Cervical back: Normal range of motion.  Skin:    General: Skin is dry.     Findings: No lesion or rash.  Neurological:     Mental Status: He is alert and oriented to person, place, and time.  Psychiatric:        Speech: Speech normal.     ED Results / Procedures / Treatments   Labs (all labs ordered are listed, but only abnormal results are displayed) Labs Reviewed  COMPREHENSIVE METABOLIC PANEL - Abnormal; Notable for the following components:      Result Value   Glucose, Bld 108 (*)    Calcium 8.6 (*)    Alkaline Phosphatase 136 (*)    All other components within normal limits  SALICYLATE LEVEL - Abnormal; Notable for the following components:   Salicylate Lvl <7.0 (*)    All other components within normal limits  ACETAMINOPHEN LEVEL - Abnormal; Notable for the following components:   Acetaminophen (Tylenol), Serum <10 (*)    All other components within normal limits  RAPID URINE DRUG SCREEN, HOSP PERFORMED - Abnormal; Notable for the following components:   Opiates POSITIVE (*)    Cocaine POSITIVE (*)    Tetrahydrocannabinol POSITIVE (*)    All other components within normal limits  ETHANOL  CBC WITH DIFFERENTIAL/PLATELET    EKG None  Radiology No results found.  Procedures Procedures    Medications Ordered in ED Medications - No data to display  ED Course/ Medical Decision Making/ A&P                                 Medical Decision Making Amount and/or Complexity of Data Reviewed Labs: ordered.   This patient presents to the ED for concern of possible parasitic infection, this involves an extensive number of treatment options, and is a complaint that carries with it a high risk of complications and morbidity.  The differential diagnosis includes parasitic infection, mental disorder, delusions, drug use, others   Co morbidities that complicate the patient  evaluation  History of polysubstance abuse  Additional history obtained:   External records from outside source obtained and reviewed including outside emergency department notes showing previous encounter with diagnosis of parasitosis.  UDS on October 2 positive for amphetamines and THC   Lab Tests:  I Ordered, and personally interpreted labs.  The pertinent results include: Negative ethanol, grossly unremarkable CBC, CMP, Tylenol, salicylate levels.  UDS positive for THC, cocaine, opiates   Social Determinants of Health:  Patient has Medicaid for his primary health insurance type.  He has no primary care provider.   Test / Admission - Considered:  Patient with history of similar presentation.  No skin lesions or rash appreciated.  No signs of parasitic infection.  Nursing staff went to check on patient and found patient masturbating in the room.  Unclear at this time if patient is actively hallucinating and has a psychiatric issue versus possible hallucinations due to substance abuse.  TTS consult placed for further psychiatric evaluation Patient is here voluntarily and I do not feel that he meets criteria for involuntary commitment at this time.         Final Clinical Impression(s) / ED Diagnoses Final diagnoses:  Anxiety  Hallucination of body sensation    Rx / DC Orders ED Discharge Orders     None         Pamala Duffel 10/04/23 5638    Nira Conn, MD 10/05/23 [phone]

## 2023-10-04 NOTE — Discharge Instructions (Signed)
Follow-up as instructed by behavioral health 

## 2023-10-04 NOTE — BH Assessment (Signed)
Attempted tele-assessment. Pt appeared somnolent with eyes closed and would not respond to questions despite multiple prompts. Pt will be assessed by day shift psych provider.   Pamalee Leyden, [facility], [facility] Triage Specialist

## 2023-10-09 ENCOUNTER — Encounter (HOSPITAL_COMMUNITY): Payer: Self-pay | Admitting: Emergency Medicine

## 2023-10-09 ENCOUNTER — Emergency Department (HOSPITAL_COMMUNITY)
Admission: EM | Admit: 2023-10-09 | Discharge: 2023-10-10 | Disposition: A | Discharge status: Home / Self Care | Payer: Medicaid Other | Attending: Emergency Medicine | Admitting: Emergency Medicine

## 2023-10-09 ENCOUNTER — Emergency Department (HOSPITAL_COMMUNITY): Payer: Medicaid Other

## 2023-10-09 ENCOUNTER — Other Ambulatory Visit: Payer: Self-pay

## 2023-10-09 DIAGNOSIS — R7309 Other abnormal glucose: Secondary | ICD-10-CM | POA: Diagnosis not present

## 2023-10-09 DIAGNOSIS — R739 Hyperglycemia, unspecified: Secondary | ICD-10-CM

## 2023-10-09 DIAGNOSIS — E876 Hypokalemia: Secondary | ICD-10-CM | POA: Diagnosis not present

## 2023-10-09 DIAGNOSIS — F1410 Cocaine abuse, uncomplicated: Secondary | ICD-10-CM | POA: Diagnosis not present

## 2023-10-09 DIAGNOSIS — E871 Hypo-osmolality and hyponatremia: Secondary | ICD-10-CM | POA: Insufficient documentation

## 2023-10-09 DIAGNOSIS — F10129 Alcohol abuse with intoxication, unspecified: Secondary | ICD-10-CM | POA: Insufficient documentation

## 2023-10-09 DIAGNOSIS — Y906 Blood alcohol level of 120-199 mg/100 ml: Secondary | ICD-10-CM | POA: Diagnosis not present

## 2023-10-09 DIAGNOSIS — F10920 Alcohol use, unspecified with intoxication, uncomplicated: Secondary | ICD-10-CM

## 2023-10-09 DIAGNOSIS — T402X1A Poisoning by other opioids, accidental (unintentional), initial encounter: Secondary | ICD-10-CM | POA: Diagnosis present

## 2023-10-09 MED ORDER — ONDANSETRON HCL 4 MG/2ML IJ SOLN
4.0000 mg | Freq: Once | INTRAMUSCULAR | Status: AC
  Administered 2023-10-09: 4 mg via INTRAVENOUS
  Filled 2023-10-09: qty 2

## 2023-10-09 MED ORDER — IPRATROPIUM-ALBUTEROL 0.5-2.5 (3) MG/3ML IN SOLN
3.0000 mL | Freq: Once | RESPIRATORY_TRACT | Status: AC
  Administered 2023-10-09: 3 mL via RESPIRATORY_TRACT
  Filled 2023-10-09: qty 3

## 2023-10-09 NOTE — ED Triage Notes (Signed)
Pt BIBA presenting lethargic, delayed responsiveness. Bystander called 911 reporting pt smoked a "purple bag".  4mg  narcan administered by fire dept, pt responsive upon arrival to ED. 18g LFA

## 2023-10-09 NOTE — ED Provider Notes (Signed)
[place] EMERGENCY DEPARTMENT AT [facility] Provider Note   CSN: [id number] Arrival date & time: 10/09/23  2254     History {Add pertinent medical, surgical, social history, OB history to HPI:1} Chief Complaint  Patient presents with   Alcohol Intoxication    William Macdonald is a 22 y.o. male.  The history is provided by the EMS personnel. The history is limited by the condition of the patient (Patient intoxicated).  Alcohol Intoxication  He was brought in by ambulance after being found unresponsive.  He was given naloxone 4 mg.  Patient is awake and able to answer questions and states that he had 2 drinks more than the limit but denies any drug use.  There was report that he was smoking something from a purple bag, but patient denies any drug use.  He does admit to smoking cigarettes.   Home Medications Prior to Admission medications   Medication Sig Start Date End Date Taking? Authorizing Provider  acyclovir (ZOVIRAX) 400 MG tablet Take 1 tablet (400 mg total) by mouth 2 (two) times daily. 10/04/23   Bethann Berkshire, MD  acyclovir (ZOVIRAX) 400 MG tablet Take 1 tablet (400 mg total) by mouth 2 (two) times daily for 8 days. 10/04/23 10/12/23  Earney Navy, NP  cetirizine HCl (ZYRTEC) 1 MG/ML solution Take 5 mg by mouth daily. Patient not taking: Reported on 10/04/2023 09/30/23   [provider]  fluconazole (DIFLUCAN) 200 MG tablet Take 200 mg by mouth daily. Patient not taking: Reported on 10/04/2023 09/30/23 10/14/23  [provider]      Allergies    Zyprexa [olanzapine]    Review of Systems   Review of Systems  Unable to perform ROS: Mental status change    Physical Exam Updated Vital Signs BP 116/80   Pulse 93   Temp (!) 97.5 F (36.4 C) (Axillary)   Resp (!) 21   Ht 6' (1.829 m)   Wt 63.5 kg   SpO2 95%   BMI 18.99 kg/m  Physical Exam Vitals and nursing note reviewed.   22 year old male, clinically intoxicated but able to  answer questions. Vital signs are normal. Oxygen saturation is 95%, which is normal.  There is a strong odor of ethanol on his breath. Head is normocephalic and atraumatic. PERRLA, EOMI. Oropharynx is clear. Neck is nontender and supple. Lungs have coarse inspiratory and expiratory wheezes and rhonchi, no rales appreciated. Chest is nontender. Heart has regular rate and rhythm without murmur. Abdomen is soft, flat, nontender. Extremities have no signs of trauma. Skin is warm and dry without rash. Neurologic: Drowsy but easily arousable, speech is slurred consistent with ethanol intoxication.  Cranial nerves are intact.  Moves all extremities equally.  ED Results / Procedures / Treatments   Labs (all labs ordered are listed, but only abnormal results are displayed) Labs Reviewed - No data to display  EKG None  Radiology No results found.  Procedures Procedures  Cardiac monitor shows sinus rhythm, per my interpretation.  Medications Ordered in ED Medications  ondansetron (ZOFRAN) injection 4 mg (4 mg Intravenous Given 10/09/23 2319)  ipratropium-albuterol (DUONEB) 0.5-2.5 (3) MG/3ML nebulizer solution 3 mL (3 mLs Nebulization Given 10/09/23 2320)    ED Course/ Medical Decision Making/ A&P   {   Click here for ABCD2, HEART and other calculatorsREFRESH Note before signing :1}  Medical Decision Making  Ethanol intoxication, possible additional drug use.  It is not clear to me that his clinical presentation at the bar was actually that of opioid overdose.  He will need to be observed in the emergency department until mental status improves.  I am concerned with wheezes and rhonchi in his lungs that he may have vomited and aspirated.  I have ordered chest x-ray and I have ordered a nebulizer treatment with albuterol and ipratropium.  I have also ordered laboratory tests of CBC, comprehensive metabolic panel, ethanol level, drug screen.  I have reviewed his  past records, and note ED visit on 09/24/2023 for alcohol intoxication, ED visit on 05/19/2019 for accidental opioid overdose, ED visit on 03/15/2023 for accidental opioid overdose.   Final Clinical Impression(s) / ED Diagnoses Final diagnoses:  None    Rx / DC Orders ED Discharge Orders     None

## 2023-10-10 LAB — COMPREHENSIVE METABOLIC PANEL
ALT: 16 U/L (ref 0–44)
AST: 26 U/L (ref 15–41)
Albumin: 4.1 g/dL (ref 3.5–5.0)
Alkaline Phosphatase: 130 U/L — ABNORMAL HIGH (ref 38–126)
Anion gap: 13 (ref 5–15)
BUN: 14 mg/dL (ref 6–20)
CO2: 20 mmol/L — ABNORMAL LOW (ref 22–32)
Calcium: 8 mg/dL — ABNORMAL LOW (ref 8.9–10.3)
Chloride: 99 mmol/L (ref 98–111)
Creatinine, Ser: 0.86 mg/dL (ref 0.61–1.24)
GFR, Estimated: >60 mL/min (ref >60)
Glucose, Bld: 212 mg/dL — ABNORMAL HIGH (ref 70–99)
Potassium: 2.9 mmol/L — ABNORMAL LOW (ref 3.5–5.1)
Sodium: 132 mmol/L — ABNORMAL LOW (ref 135–145)
Total Bilirubin: 0.4 mg/dL (ref 0.3–1.2)
Total Protein: 7.2 g/dL (ref 6.5–8.1)

## 2023-10-10 LAB — CBC WITH DIFFERENTIAL/PLATELET
Abs Immature Granulocytes: 0.1 10*3/uL — ABNORMAL HIGH (ref 0.00–0.07)
Basophils Absolute: 0 10*3/uL (ref 0.0–0.1)
Basophils Relative: 0 %
Eosinophils Absolute: 0 10*3/uL (ref 0.0–0.5)
Eosinophils Relative: 0 %
HCT: 42.6 % (ref 39.0–52.0)
Hemoglobin: 13.5 g/dL (ref 13.0–17.0)
Immature Granulocytes: 1 %
Lymphocytes Relative: 29 %
Lymphs Abs: 2 10*3/uL (ref 0.7–4.0)
MCH: 28.3 pg (ref 26.0–34.0)
MCHC: 31.7 g/dL (ref 30.0–36.0)
MCV: 89.3 fL (ref 80.0–100.0)
Monocytes Absolute: 0.4 10*3/uL (ref 0.1–1.0)
Monocytes Relative: 5 %
Neutro Abs: 4.4 10*3/uL (ref 1.7–7.7)
Neutrophils Relative %: 65 %
Platelets: 259 10*3/uL (ref 150–400)
RBC: 4.77 MIL/uL (ref 4.22–5.81)
RDW: 13.5 % (ref 11.5–15.5)
WBC: 6.9 10*3/uL (ref 4.0–10.5)
nRBC: 0 % (ref 0.0–0.2)

## 2023-10-10 LAB — RAPID URINE DRUG SCREEN, HOSP PERFORMED
Amphetamines: NOT DETECTED
Barbiturates: NOT DETECTED
Benzodiazepines: NOT DETECTED
Cocaine: POSITIVE — AB
Opiates: NOT DETECTED
Tetrahydrocannabinol: POSITIVE — AB

## 2023-10-10 LAB — ETHANOL: Alcohol, Ethyl (B): 167 mg/dL — ABNORMAL HIGH (ref <10)

## 2023-10-10 MED ORDER — POTASSIUM CHLORIDE CRYS ER 20 MEQ PO TBCR
40.0000 meq | EXTENDED_RELEASE_TABLET | Freq: Once | ORAL | Status: AC
  Administered 2023-10-10: 40 meq via ORAL
  Filled 2023-10-10: qty 2

## 2023-10-10 MED ORDER — NALOXONE HCL 4 MG/10ML IJ SOLN
1.5000 mg/h | INTRAVENOUS | Status: DC
  Administered 2023-10-10: 1.5 mg/h via INTRAVENOUS
  Filled 2023-10-10 (×3): qty 10

## 2023-10-10 MED ORDER — POTASSIUM CHLORIDE 10 MEQ/100ML IV SOLN
10.0000 meq | Freq: Once | INTRAVENOUS | Status: AC
  Administered 2023-10-10: 10 meq via INTRAVENOUS
  Filled 2023-10-10: qty 100

## 2023-10-10 MED ORDER — NALOXONE HCL 2 MG/2ML IJ SOSY
2.0000 mg | PREFILLED_SYRINGE | Freq: Once | INTRAMUSCULAR | Status: AC
  Administered 2023-10-10: 2 mg via INTRAVENOUS
  Filled 2023-10-10: qty 2

## 2023-10-10 NOTE — ED Provider Notes (Signed)
  Physical Exam  BP (!) 100/48   Pulse 84   Temp 98 F (36.7 C) (Oral)   Resp 12   Ht 1.829 m (6')   Wt 63.5 kg   SpO2 94%   BMI 18.99 kg/m   Physical Exam  Procedures  Procedures  ED Course / MDM    Medical Decision Making Amount and/or Complexity of Data Reviewed Labs: ordered. Radiology: ordered.  Risk Prescription drug management.  Received signe out from Dr. Preston Fleeting 22 yo male presented loc with OD.  Initially responded to narcan, then became less responsive with decreaed sats.  Patient on narcan drip-d/c'd 521.  He has been breathing ok since.  UDS positive for cocaine, thc, etoh.  Will reevaluate at 830 for d/c vs need for admission  8:15 AM Patient requesting juice Sleeping but easily arousable  Has taken p.o. He appears stable for discharge Patient given outpatient resources advised regarding return precautions and need for follow-up     Margarita Grizzle, MD 10/10/23 [phone]

## 2023-10-10 NOTE — Discharge Instructions (Signed)
You were seen here today for an overdose. Cocaine, THC, and alcohol were all noted in your lab work and you responded to Narcan You have had several metabolic abnormalities likely due to your overdose and dehydration. Please drink plenty of fluids.  You should be with someone who can monitor your responsiveness. You are given a resource guide for outpatient substance abuse and counseling. Please follow-up with your doctor this week

## 2023-10-12 ENCOUNTER — Encounter (HOSPITAL_COMMUNITY): Payer: Self-pay

## 2023-10-12 ENCOUNTER — Other Ambulatory Visit: Payer: Self-pay

## 2023-10-12 ENCOUNTER — Emergency Department (HOSPITAL_COMMUNITY): Payer: Medicaid Other

## 2023-10-12 ENCOUNTER — Emergency Department (HOSPITAL_COMMUNITY)
Admission: EM | Admit: 2023-10-12 | Discharge: 2023-10-12 | Disposition: A | Discharge status: Home / Self Care | Payer: Medicaid Other | Attending: Emergency Medicine | Admitting: Emergency Medicine

## 2023-10-12 DIAGNOSIS — H53149 Visual discomfort, unspecified: Secondary | ICD-10-CM | POA: Diagnosis not present

## 2023-10-12 DIAGNOSIS — R519 Headache, unspecified: Secondary | ICD-10-CM | POA: Diagnosis present

## 2023-10-12 DIAGNOSIS — R110 Nausea: Secondary | ICD-10-CM | POA: Diagnosis not present

## 2023-10-12 DIAGNOSIS — M542 Cervicalgia: Secondary | ICD-10-CM | POA: Diagnosis not present

## 2023-10-12 LAB — CBC WITH DIFFERENTIAL/PLATELET
Abs Immature Granulocytes: 0.02 10*3/uL (ref 0.00–0.07)
Basophils Absolute: 0 10*3/uL (ref 0.0–0.1)
Basophils Relative: 1 %
Eosinophils Absolute: 0.2 10*3/uL (ref 0.0–0.5)
Eosinophils Relative: 2 %
HCT: 39.9 % (ref 39.0–52.0)
Hemoglobin: 12.8 g/dL — ABNORMAL LOW (ref 13.0–17.0)
Immature Granulocytes: 0 %
Lymphocytes Relative: 42 %
Lymphs Abs: 3.5 10*3/uL (ref 0.7–4.0)
MCH: 27.8 pg (ref 26.0–34.0)
MCHC: 32.1 g/dL (ref 30.0–36.0)
MCV: 86.6 fL (ref 80.0–100.0)
Monocytes Absolute: 0.6 10*3/uL (ref 0.1–1.0)
Monocytes Relative: 7 %
Neutro Abs: 4 10*3/uL (ref 1.7–7.7)
Neutrophils Relative %: 48 %
Platelets: 275 10*3/uL (ref 150–400)
RBC: 4.61 MIL/uL (ref 4.22–5.81)
RDW: 13.7 % (ref 11.5–15.5)
WBC: 8.3 10*3/uL (ref 4.0–10.5)
nRBC: 0 % (ref 0.0–0.2)

## 2023-10-12 LAB — BASIC METABOLIC PANEL
Anion gap: 11 (ref 5–15)
BUN: 12 mg/dL (ref 6–20)
CO2: 25 mmol/L (ref 22–32)
Calcium: 9.2 mg/dL (ref 8.9–10.3)
Chloride: 102 mmol/L (ref 98–111)
Creatinine, Ser: 0.6 mg/dL — ABNORMAL LOW (ref 0.61–1.24)
GFR, Estimated: >60 mL/min (ref >60)
Glucose, Bld: 101 mg/dL — ABNORMAL HIGH (ref 70–99)
Potassium: 3.7 mmol/L (ref 3.5–5.1)
Sodium: 138 mmol/L (ref 135–145)

## 2023-10-12 LAB — ETHANOL: Alcohol, Ethyl (B): <10 mg/dL (ref <10)

## 2023-10-12 MED ORDER — DIPHENHYDRAMINE HCL 50 MG/ML IJ SOLN
12.5000 mg | Freq: Once | INTRAMUSCULAR | Status: AC
  Administered 2023-10-12: 12.5 mg via INTRAVENOUS
  Filled 2023-10-12: qty 1

## 2023-10-12 MED ORDER — METOCLOPRAMIDE HCL 5 MG/ML IJ SOLN
10.0000 mg | Freq: Once | INTRAMUSCULAR | Status: AC
  Administered 2023-10-12: 10 mg via INTRAVENOUS
  Filled 2023-10-12: qty 2

## 2023-10-12 MED ORDER — KETOROLAC TROMETHAMINE 15 MG/ML IJ SOLN
15.0000 mg | Freq: Once | INTRAMUSCULAR | Status: AC
  Administered 2023-10-12: 15 mg via INTRAVENOUS
  Filled 2023-10-12: qty 1

## 2023-10-12 NOTE — ED Triage Notes (Signed)
Pt complaining of a headache on the top of his head that started earlier tonight. Said that light and noise affects them.

## 2023-10-12 NOTE — ED Notes (Signed)
Pt verbalized understanding of discharge instructions. Pt ambulated from ed with steady gait. IV removed.

## 2023-10-12 NOTE — Discharge Instructions (Signed)
You have been seen today for your complaint of headache. Your lab work was reassuring. Your imaging was reassuring. Follow up with: a PCP of your choice within the next week Please seek immediate medical care if you develop any of the following symptoms: Your headache: Becomes severe quickly. Gets worse after moderate to intense physical activity. You have any of these symptoms: Repeated vomiting. Pain or stiffness in your neck. Changes to your vision. Pain in an eye or ear. Problems with speech. Muscular weakness or loss of muscle control. Loss of balance or coordination. You feel faint or pass out. You have confusion. You have a seizure. At this time there does not appear to be the presence of an emergent medical condition, however there is always the potential for conditions to change. Please read and follow the below instructions.  Do not take your medicine if  develop an itchy rash, swelling in your mouth or lips, or difficulty breathing; call 911 and seek immediate emergency medical attention if this occurs.  You may review your lab tests and imaging results in their entirety on your MyChart account.  Please discuss all results of fully with your primary care provider and other specialist at your follow-up visit.  Note: Portions of this text may have been transcribed using voice recognition software. Every effort was made to ensure accuracy; however, inadvertent computerized transcription errors may still be present.

## 2023-10-12 NOTE — ED Provider Notes (Signed)
[place] EMERGENCY DEPARTMENT AT [facility] Provider Note   CSN: [id number] Arrival date & time: 10/12/23  0120     History  Chief Complaint  Patient presents with   Headache    William Macdonald is a 22 y.o. male.  With history of polysubstance abuse presenting to the ED for evaluation of a headache.  He states this headache began 2 to 3 days ago.  He was intoxicated at the time of headache onset but states that it has progressively gotten worse and was not sudden in onset.  It is localized to the top of his head.  He is unsure of any history of trauma due to the intoxication.  He reports some mild neck pain but denies any neck stiffness or fevers.  Some nausea but no vomiting.  He states he has some photophobia and phonophobia as well.  He denies any vision changes, numbness, weakness, tingling.   Headache      Home Medications Prior to Admission medications   Medication Sig Start Date End Date Taking? Authorizing Provider  acyclovir (ZOVIRAX) 400 MG tablet Take 1 tablet (400 mg total) by mouth 2 (two) times daily. 10/04/23   Bethann Berkshire, MD  acyclovir (ZOVIRAX) 400 MG tablet Take 1 tablet (400 mg total) by mouth 2 (two) times daily for 8 days. 10/04/23 10/12/23  Earney Navy, NP  cetirizine HCl (ZYRTEC) 1 MG/ML solution Take 5 mg by mouth daily. Patient not taking: Reported on 10/04/2023 09/30/23   [provider]  fluconazole (DIFLUCAN) 200 MG tablet Take 200 mg by mouth daily. Patient not taking: Reported on 10/04/2023 09/30/23 10/14/23  [provider]      Allergies    Zyprexa [olanzapine]    Review of Systems   Review of Systems  Neurological:  Positive for headaches.  All other systems reviewed and are negative.   Physical Exam Updated Vital Signs BP 100/69   Pulse 77   Temp 97.7 F (36.5 C)   Resp 18   Ht 6' (1.829 m)   Wt 62.6 kg   SpO2 100%   BMI 18.72 kg/m  Physical Exam Vitals and nursing note reviewed.   Constitutional:      General: He is not in acute distress.    Appearance: He is well-developed.  HENT:     Head: Normocephalic and atraumatic.  Eyes:     Conjunctiva/sclera: Conjunctivae normal.  Neck:     Meningeal: Brudzinski's sign and Kernig's sign absent.  Cardiovascular:     Rate and Rhythm: Normal rate and regular rhythm.     Heart sounds: No murmur heard. Pulmonary:     Effort: Pulmonary effort is normal. No respiratory distress.     Breath sounds: Normal breath sounds.  Abdominal:     Palpations: Abdomen is soft.     Tenderness: There is no abdominal tenderness.  Musculoskeletal:        General: No swelling.     Cervical back: Neck supple. No rigidity.  Skin:    General: Skin is warm and dry.     Capillary Refill: Capillary refill takes less than 2 seconds.  Neurological:     Mental Status: He is alert and oriented to person, place, and time.     Comments:   MENTAL STATUS: AAOx3   LANG/SPEECH: Fluent, intact naming, repetition & comprehension   CRANIAL NERVES:   II: Pupils equal and reactive   III, IV, VI: EOM intact, no gaze preference or deviation, no nystagmus  V: normal sensation of the face   VII: no facial asymmetry   VIII: normal hearing to speech   MOTOR: 5/5 in both upper and lower extremities   SENSORY: Normal to touch in all extremiteis   COORD: Normal finger to nose, heel to shin and shoulder shrug, no tremor, no dysmetria. No pronator drift   Psychiatric:        Mood and Affect: Mood normal.     ED Results / Procedures / Treatments   Labs (all labs ordered are listed, but only abnormal results are displayed) Labs Reviewed  BASIC METABOLIC PANEL - Abnormal; Notable for the following components:      Result Value   Glucose, Bld 101 (*)    Creatinine, Ser 0.60 (*)    All other components within normal limits  CBC WITH DIFFERENTIAL/PLATELET - Abnormal; Notable for the following components:   Hemoglobin 12.8 (*)    All other components within  normal limits  ETHANOL    EKG None  Radiology CT Head Wo Contrast  Result Date: 10/12/2023 CLINICAL DATA:  Acute neck pain.  Headache that started tonight. EXAM: CT HEAD WITHOUT CONTRAST CT CERVICAL SPINE WITHOUT CONTRAST TECHNIQUE: Multidetector CT imaging of the head and cervical spine was performed following the standard protocol without intravenous contrast. Multiplanar CT image reconstructions of the cervical spine were also generated. RADIATION DOSE REDUCTION: This exam was performed according to the departmental dose-optimization program which includes automated exposure control, adjustment of the mA and/or kV according to patient size and/or use of iterative reconstruction technique. COMPARISON:  None Available. FINDINGS: CT HEAD FINDINGS Brain: No evidence of acute infarction, hemorrhage, hydrocephalus, extra-axial collection or mass lesion/mass effect. Vascular: No hyperdense vessel or unexpected calcification. Skull: Normal. Negative for fracture or focal lesion. Sinuses/Orbits: Negative CT CERVICAL SPINE FINDINGS Alignment: Normal. Skull base and vertebrae: No acute fracture. No primary bone lesion or focal pathologic process. Soft tissues and spinal canal: No prevertebral fluid or swelling. No visible canal hematoma. Disc levels:  No degenerative changes Upper chest: Clear apical lungs IMPRESSION: Negative head and cervical spine CT. Electronically Signed   By: Tiburcio Pea M.D.   On: 10/12/2023 05:44   CT Cervical Spine Wo Contrast  Result Date: 10/12/2023 CLINICAL DATA:  Acute neck pain.  Headache that started tonight. EXAM: CT HEAD WITHOUT CONTRAST CT CERVICAL SPINE WITHOUT CONTRAST TECHNIQUE: Multidetector CT imaging of the head and cervical spine was performed following the standard protocol without intravenous contrast. Multiplanar CT image reconstructions of the cervical spine were also generated. RADIATION DOSE REDUCTION: This exam was performed according to the departmental  dose-optimization program which includes automated exposure control, adjustment of the mA and/or kV according to patient size and/or use of iterative reconstruction technique. COMPARISON:  None Available. FINDINGS: CT HEAD FINDINGS Brain: No evidence of acute infarction, hemorrhage, hydrocephalus, extra-axial collection or mass lesion/mass effect. Vascular: No hyperdense vessel or unexpected calcification. Skull: Normal. Negative for fracture or focal lesion. Sinuses/Orbits: Negative CT CERVICAL SPINE FINDINGS Alignment: Normal. Skull base and vertebrae: No acute fracture. No primary bone lesion or focal pathologic process. Soft tissues and spinal canal: No prevertebral fluid or swelling. No visible canal hematoma. Disc levels:  No degenerative changes Upper chest: Clear apical lungs IMPRESSION: Negative head and cervical spine CT. Electronically Signed   By: Tiburcio Pea M.D.   On: 10/12/2023 05:44    Procedures Procedures    Medications Ordered in ED Medications  ketorolac (TORADOL) 15 MG/ML injection 15 mg (15 mg Intravenous  Given 10/12/23 0501)  diphenhydrAMINE (BENADRYL) injection 12.5 mg (12.5 mg Intravenous Given 10/12/23 0502)  metoCLOPramide (REGLAN) injection 10 mg (10 mg Intravenous Given 10/12/23 0501)    ED Course/ Medical Decision Making/ A&P                                 Medical Decision Making Amount and/or Complexity of Data Reviewed Labs: ordered. Radiology: ordered.  Risk Prescription drug management.   This patient presents to the ED for concern of headache, this involves an extensive number of treatment options, and is a complaint that carries with it a high risk of complications and morbidity.  Emergent considerations for headache include subarachnoid hemorrhage, meningitis, temporal arteritis, glaucoma, cerebral ischemia, carotid/vertebral dissection, intracranial tumor, Venous sinus thrombosis, carbon monoxide poisoning, acute or chronic subdural hemorrhage.   Other considerations include: Migraine, Cluster headache, Hypertension, Caffeine, alcohol, or drug withdrawal, Pseudotumor cerebri, Arteriovenous malformation, Head injury, Neurocysticercosis, Post-lumbar puncture, Preeclampsia, Tension headache, Sinusitis, Cervical arthritis, Refractive error causing strain, Dental abscess, Otitis media, Temporomandibular joint syndrome, Depression, Somatoform disorder (eg, somatization) Trigeminal neuralgia, Glossopharyngeal neuralgia.   My initial workup includes basic labs, imaging, symptom control  Additional history obtained from: Nursing notes from this visit. Previous records within EMR system 7 ED visits in the past 6 months  I ordered, reviewed and interpreted labs which include: BMP, CBC.  No leukocytosis to suggest systemic infection.  No significant electrolyte derangement.  No anemia.  I ordered imaging studies including CT head, C-spine I independently visualized and interpreted imaging which showed negative imaging I agree with the radiologist interpretation  Afebrile, hemodynamically stable.  22 year old male presenting to the ED for evaluation of a headache.  This is been present for the past 2 to 3 days.  He denies any trauma but states he was intoxicated when the headache began.  It is localized to the top of the head.  No other neurologic complaints.  No focal neurologic deficits on exam.  Lab workup and imaging reassuring.  No meningismus.  Overall very low suspicion for infectious etiology or subarachnoid hemorrhage or dural venous thrombosis or other acute emergent etiology of his headache.  I do suspect a component of malingering as he was immediately asking nursing staff for food when he was brought back to his room, was fully cooperative with initial examination and then became very uncooperative when notified of his reassuring workup and pending discharge.  He was encouraged to follow-up with a primary care provider of his choice in 1 week  for reevaluation of his symptoms.  He was given return precautions.  Stable at discharge.  At this time there does not appear to be any evidence of an acute emergency medical condition and the patient appears stable for discharge with appropriate outpatient follow up. Diagnosis was discussed with patient who verbalizes understanding of care plan and is agreeable to discharge. I have discussed return precautions with patient who verbalizes understanding. Patient encouraged to follow-up with their PCP within 1 week. All questions answered.  Note: Portions of this report may have been transcribed using voice recognition software. Every effort was made to ensure accuracy; however, inadvertent computerized transcription errors may still be present.        Final Clinical Impression(s) / ED Diagnoses Final diagnoses:  Bad headache    Rx / DC Orders ED Discharge Orders     None         Jaegar Croft  Blair Heys 10/12/23 0602    Dione Booze, MD 10/12/23 [phone]

## 2023-10-18 ENCOUNTER — Emergency Department (HOSPITAL_COMMUNITY): Payer: Medicaid Other

## 2023-10-18 ENCOUNTER — Other Ambulatory Visit: Payer: Self-pay

## 2023-10-18 ENCOUNTER — Encounter (HOSPITAL_COMMUNITY): Payer: Self-pay

## 2023-10-18 ENCOUNTER — Emergency Department (HOSPITAL_COMMUNITY)
Admission: EM | Admit: 2023-10-18 | Discharge: 2023-10-18 | Disposition: A | Discharge status: Home / Self Care | Payer: Medicaid Other | Attending: Emergency Medicine | Admitting: Emergency Medicine

## 2023-10-18 DIAGNOSIS — R519 Headache, unspecified: Secondary | ICD-10-CM | POA: Diagnosis not present

## 2023-10-18 DIAGNOSIS — R079 Chest pain, unspecified: Secondary | ICD-10-CM | POA: Diagnosis present

## 2023-10-18 DIAGNOSIS — J029 Acute pharyngitis, unspecified: Secondary | ICD-10-CM | POA: Diagnosis not present

## 2023-10-18 DIAGNOSIS — M549 Dorsalgia, unspecified: Secondary | ICD-10-CM | POA: Diagnosis not present

## 2023-10-18 LAB — CBC
HCT: 39.5 % (ref 39.0–52.0)
Hemoglobin: 12.9 g/dL — ABNORMAL LOW (ref 13.0–17.0)
MCH: 28 pg (ref 26.0–34.0)
MCHC: 32.7 g/dL (ref 30.0–36.0)
MCV: 85.7 fL (ref 80.0–100.0)
Platelets: 250 10*3/uL (ref 150–400)
RBC: 4.61 MIL/uL (ref 4.22–5.81)
RDW: 13.1 % (ref 11.5–15.5)
WBC: 7.3 10*3/uL (ref 4.0–10.5)
nRBC: 0 % (ref 0.0–0.2)

## 2023-10-18 LAB — SALICYLATE LEVEL: Salicylate Lvl: <7 mg/dL — ABNORMAL LOW (ref 7.0–30.0)

## 2023-10-18 LAB — RAPID URINE DRUG SCREEN, HOSP PERFORMED
Amphetamines: NOT DETECTED
Barbiturates: NOT DETECTED
Benzodiazepines: NOT DETECTED
Cocaine: POSITIVE — AB
Opiates: NOT DETECTED
Tetrahydrocannabinol: POSITIVE — AB

## 2023-10-18 LAB — BASIC METABOLIC PANEL
Anion gap: 10 (ref 5–15)
BUN: 6 mg/dL (ref 6–20)
CO2: 28 mmol/L (ref 22–32)
Calcium: 8.7 mg/dL — ABNORMAL LOW (ref 8.9–10.3)
Chloride: 98 mmol/L (ref 98–111)
Creatinine, Ser: 0.76 mg/dL (ref 0.61–1.24)
GFR, Estimated: >60 mL/min (ref >60)
Glucose, Bld: 102 mg/dL — ABNORMAL HIGH (ref 70–99)
Potassium: 3.6 mmol/L (ref 3.5–5.1)
Sodium: 136 mmol/L (ref 135–145)

## 2023-10-18 LAB — ACETAMINOPHEN LEVEL: Acetaminophen (Tylenol), Serum: <10 ug/mL — ABNORMAL LOW (ref 10–30)

## 2023-10-18 LAB — ETHANOL: Alcohol, Ethyl (B): <10 mg/dL (ref <10)

## 2023-10-18 MED ORDER — ACETAMINOPHEN 500 MG PO TABS
1000.0000 mg | ORAL_TABLET | Freq: Once | ORAL | Status: AC
  Administered 2023-10-18: 1000 mg via ORAL
  Filled 2023-10-18: qty 2

## 2023-10-18 NOTE — Discharge Instructions (Addendum)
You are seen in the emergency department for headache and pain in your chest Your blood work EKG and chest x-ray looked okay We are not certain what is causing your discomfort You will need to return to the emergency department for severe chest pain, trouble breathing or any other concerns

## 2023-10-18 NOTE — ED Triage Notes (Signed)
Pt c/o midsternal chest pain and pain in back in rib area, HA, sore throatx3d. Pt appears to be under th enfluence.

## 2023-10-18 NOTE — ED Provider Notes (Signed)
[place] EMERGENCY DEPARTMENT AT [facility] Provider Note   CSN: [id number] Arrival date & time: 10/18/23  1047     History  Chief Complaint  Patient presents with   Chest Pain    KRUZE VOLZ is a 22 y.o. male.  With a past medical history of polysubstance use who presents to the ED for chest pain.  The patient reports 3 days of symptoms including headache sore throat chest pain.  No fevers chills abdominal pain nausea or vomiting   Chest Pain      Home Medications Prior to Admission medications   Medication Sig Start Date End Date Taking? Authorizing Provider  acyclovir (ZOVIRAX) 400 MG tablet Take 1 tablet (400 mg total) by mouth 2 (two) times daily. 10/04/23   Bethann Berkshire, MD  cetirizine HCl (ZYRTEC) 1 MG/ML solution Take 5 mg by mouth daily. Patient not taking: Reported on 10/04/2023 09/30/23   [provider]      Allergies    Zyprexa [olanzapine]    Review of Systems   Review of Systems  Cardiovascular:  Positive for chest pain.    Physical Exam Updated Vital Signs BP 120/82 (BP Location: Right Arm)   Pulse 81   Temp 98.1 F (36.7 C)   Resp 17   Ht 6' (1.829 m)   Wt 62.6 kg   SpO2 100%   BMI 18.72 kg/m  Physical Exam Vitals and nursing note reviewed.  HENT:     Head: Normocephalic and atraumatic.  Eyes:     Pupils: Pupils are equal, round, and reactive to light.  Cardiovascular:     Rate and Rhythm: Normal rate and regular rhythm.  Pulmonary:     Effort: Pulmonary effort is normal.     Breath sounds: Normal breath sounds.  Abdominal:     Palpations: Abdomen is soft.     Tenderness: There is no abdominal tenderness.  Skin:    General: Skin is warm and dry.  Neurological:     Mental Status: He is alert.  Psychiatric:        Mood and Affect: Mood normal.     ED Results / Procedures / Treatments   Labs (all labs ordered are listed, but only abnormal results are displayed) Labs Reviewed  BASIC METABOLIC PANEL  - Abnormal; Notable for the following components:      Result Value   Glucose, Bld 102 (*)    Calcium 8.7 (*)    All other components within normal limits  CBC - Abnormal; Notable for the following components:   Hemoglobin 12.9 (*)    All other components within normal limits  SALICYLATE LEVEL - Abnormal; Notable for the following components:   Salicylate Lvl <7.0 (*)    All other components within normal limits  ACETAMINOPHEN LEVEL - Abnormal; Notable for the following components:   Acetaminophen (Tylenol), Serum <10 (*)    All other components within normal limits  ETHANOL  RAPID URINE DRUG SCREEN, HOSP PERFORMED    EKG EKG Interpretation Date/Time:  Monday October 18 2023 11:46:09 EDT Ventricular Rate:  69 PR Interval:  130 QRS Duration:  88 QT Interval:  396 QTC Calculation: 424 R Axis:   83  Text Interpretation: Normal sinus rhythm Normal ECG When compared with ECG of 24-Sep-2023 13:43, PREVIOUS ECG IS PRESENT Confirmed by Estelle June [id number]) on 10/18/2023 3:28:48 PM  Radiology DG Chest 2 View  Result Date: 10/18/2023 CLINICAL DATA:  Midsternal chest pain. EXAM: CHEST - 2 VIEW  COMPARISON:  10/09/2023. FINDINGS: Bilateral lung fields are clear. Bilateral costophrenic angles are clear. Normal cardio-mediastinal silhouette. No acute osseous abnormalities. The soft tissues are within normal limits. IMPRESSION: No active cardiopulmonary disease. Electronically Signed   By: Jules Schick M.D.   On: 10/18/2023 15:15    Procedures Procedures    Medications Ordered in ED Medications  acetaminophen (TYLENOL) tablet 1,000 mg (1,000 mg Oral Given 10/18/23 1220)    ED Course/ Medical Decision Making/ A&P Clinical Course as of 10/18/23 1530  Mon Oct 18, 2023  1530 Laboratory workup, chest x-ray and EKG are all unremarkable.  Patient resting comfortably able to eat and drink here in the emergency department.  Stable for discharge at this time.  Return precautions discussed  in detail [MP]    Clinical Course User Index [MP] Royanne Foots, DO                                 Medical Decision Making 22 year old male with history of polysubstance use presenting for constellation of symptoms for last 3 days.  Headache sore throat chest pain back pain.  Afebrile normotensive on exam.  Sleeping comfortably in ED bed on my assessment.  Benign physical exam.  Will obtain laboratory workup to evaluate for leukocytosis, anemia, electrolyte imbalance and chest x-ray to look for focal consolidation that would be concerning for bacterial pneumonia.  Low suspicion for ACS as he has no major risk factors and is young and age.  Will obtain EKG to evaluate for dysrhythmia and continue to monitor.  Amount and/or Complexity of Data Reviewed Labs: ordered. Radiology: ordered.           Final Clinical Impression(s) / ED Diagnoses Final diagnoses:  Chest pain, unspecified type    Rx / DC Orders ED Discharge Orders     None         Royanne Foots, DO 10/18/23 1530

## 2023-10-18 NOTE — ED Provider Triage Note (Signed)
Emergency Medicine Provider Triage Evaluation Note  William Macdonald , a 22 y.o. male  was evaluated in triage.  Pt complains of Chest pain, x 2 day.  Review of Systems  Positive: Chest pain, headache Negative: Vomiting, SOB  Physical Exam  BP 120/82 (BP Location: Right Arm)   Pulse 81   Temp 98.1 F (36.7 C)   Resp 17   Ht 6' (1.829 m)   Wt 62.6 kg   SpO2 100%   BMI 18.72 kg/m  Gen:   Awake, no distress  Acutely intoxicated Resp:  Normal effort  MSK:   Moves extremities without difficulty  Other:  RRR, no chest wall bruising Abd:   Diffusely tender  Medical Decision Making  Medically screening exam initiated at 12:06 PM.  Appropriate orders placed.  William Macdonald was informed that the remainder of the evaluation will be completed by another provider, this initial triage assessment does not replace that evaluation, and the importance of remaining in the ED until their evaluation is complete.  Patient complains of multiple areas of pain. Goes into "all the things that happened to me in the past." Denies alcohol/drugs but appears intoxicated.  Basic work up started.    Elpidio Anis, PA-C 10/18/23 1210

## 2023-11-20 ENCOUNTER — Other Ambulatory Visit: Payer: Self-pay

## 2023-11-20 ENCOUNTER — Emergency Department (HOSPITAL_COMMUNITY)
Admission: EM | Admit: 2023-11-20 | Discharge: 2023-11-20 | Disposition: A | Discharge status: Home / Self Care | Payer: Medicaid Other | Attending: Emergency Medicine | Admitting: Emergency Medicine

## 2023-11-20 ENCOUNTER — Encounter (HOSPITAL_COMMUNITY): Payer: Self-pay

## 2023-11-20 DIAGNOSIS — R1013 Epigastric pain: Secondary | ICD-10-CM | POA: Insufficient documentation

## 2023-11-20 DIAGNOSIS — R1084 Generalized abdominal pain: Secondary | ICD-10-CM

## 2023-11-20 LAB — COMPREHENSIVE METABOLIC PANEL
ALT: 22 U/L (ref 0–44)
AST: 25 U/L (ref 15–41)
Albumin: 3.7 g/dL (ref 3.5–5.0)
Alkaline Phosphatase: 191 U/L — ABNORMAL HIGH (ref 38–126)
Anion gap: 7 (ref 5–15)
BUN: 11 mg/dL (ref 6–20)
CO2: 25 mmol/L (ref 22–32)
Calcium: 8.4 mg/dL — ABNORMAL LOW (ref 8.9–10.3)
Chloride: 105 mmol/L (ref 98–111)
Creatinine, Ser: 0.64 mg/dL (ref 0.61–1.24)
GFR, Estimated: >60 mL/min (ref >60)
Glucose, Bld: 107 mg/dL — ABNORMAL HIGH (ref 70–99)
Potassium: 3.8 mmol/L (ref 3.5–5.1)
Sodium: 137 mmol/L (ref 135–145)
Total Bilirubin: 0.3 mg/dL (ref <1.2)
Total Protein: 7 g/dL (ref 6.5–8.1)

## 2023-11-20 LAB — CBC WITH DIFFERENTIAL/PLATELET
Abs Immature Granulocytes: 0.01 10*3/uL (ref 0.00–0.07)
Basophils Absolute: 0 10*3/uL (ref 0.0–0.1)
Basophils Relative: 0 %
Eosinophils Absolute: 0.1 10*3/uL (ref 0.0–0.5)
Eosinophils Relative: 1 %
HCT: 40.8 % (ref 39.0–52.0)
Hemoglobin: 12.8 g/dL — ABNORMAL LOW (ref 13.0–17.0)
Immature Granulocytes: 0 %
Lymphocytes Relative: 32 %
Lymphs Abs: 2.1 10*3/uL (ref 0.7–4.0)
MCH: 27.4 pg (ref 26.0–34.0)
MCHC: 31.4 g/dL (ref 30.0–36.0)
MCV: 87.2 fL (ref 80.0–100.0)
Monocytes Absolute: 0.5 10*3/uL (ref 0.1–1.0)
Monocytes Relative: 8 %
Neutro Abs: 3.7 10*3/uL (ref 1.7–7.7)
Neutrophils Relative %: 59 %
Platelets: 270 10*3/uL (ref 150–400)
RBC: 4.68 MIL/uL (ref 4.22–5.81)
RDW: 12.7 % (ref 11.5–15.5)
WBC: 6.4 10*3/uL (ref 4.0–10.5)
nRBC: 0 % (ref 0.0–0.2)

## 2023-11-20 LAB — LIPASE, BLOOD: Lipase: 30 U/L (ref 11–51)

## 2023-11-20 LAB — ETHANOL: Alcohol, Ethyl (B): <10 mg/dL (ref <10)

## 2023-11-20 MED ORDER — ACETAMINOPHEN 500 MG PO TABS
1000.0000 mg | ORAL_TABLET | Freq: Once | ORAL | Status: AC
  Administered 2023-11-20: 1000 mg via ORAL
  Filled 2023-11-20: qty 2

## 2023-11-20 NOTE — ED Provider Notes (Signed)
[place] EMERGENCY DEPARTMENT AT [facility] Provider Note   CSN: [id number] Arrival date & time: 11/20/23  1810     History  Chief Complaint  Patient presents with   Abdominal Pain    William Macdonald is a 22 y.o. male with past medical history of psychosis, homelessness, substance abuse presenting to emergency room with 3 days of abdominal pain.  Patient reports pain is in epigastric area.  He has been tolerating p.o. intake has not had any other associated symptoms denies nausea vomiting diarrhea, fever chills or upper respiratory tract like symptoms.  Denies recent trauma or falls.  Patient admits to cocaine use earlier today. Denies ETOH, denies IVDU.    Abdominal Pain      Home Medications Prior to Admission medications   Medication Sig Start Date End Date Taking? Authorizing Provider  acyclovir (ZOVIRAX) 400 MG tablet Take 1 tablet (400 mg total) by mouth 2 (two) times daily. 10/04/23   Bethann Berkshire, MD  cetirizine HCl (ZYRTEC) 1 MG/ML solution Take 5 mg by mouth daily. Patient not taking: Reported on 10/04/2023 09/30/23   [provider]      Allergies    Zyprexa [olanzapine]    Review of Systems   Review of Systems  Gastrointestinal:  Positive for abdominal pain.    Physical Exam Updated Vital Signs BP 137/75 (BP Location: Left Arm)   Pulse 84   Temp 98.7 F (37.1 C) (Oral)   Resp 16   Ht 6' (1.829 m)   Wt 62.6 kg   SpO2 91%   BMI 18.72 kg/m  Physical Exam Vitals and nursing note reviewed.  Constitutional:      General: He is not in acute distress.    Appearance: He is not toxic-appearing.     Comments: Sleepy, slow to respond. A&O, no slurred speech.   HENT:     Head: Normocephalic and atraumatic.     Mouth/Throat:     Mouth: Mucous membranes are moist.     Pharynx: Oropharynx is clear.  Eyes:     General: No scleral icterus.    Conjunctiva/sclera: Conjunctivae normal.  Cardiovascular:     Rate and Rhythm: Normal rate  and regular rhythm.     Pulses: Normal pulses.     Heart sounds: Normal heart sounds.  Pulmonary:     Effort: Pulmonary effort is normal. No respiratory distress.     Breath sounds: Normal breath sounds. No wheezing.     Comments: Normal respiratory effort Chest:     Chest wall: No tenderness.  Abdominal:     General: Abdomen is flat. Bowel sounds are normal.     Palpations: Abdomen is soft.     Tenderness: There is abdominal tenderness.     Comments: Epigastric - no obvious deformity or bruising over area  Musculoskeletal:     Right lower leg: No edema.     Left lower leg: No edema.  Skin:    General: Skin is warm and dry.     Capillary Refill: Capillary refill takes less than 2 seconds.     Coloration: Skin is not jaundiced.     Findings: No lesion.  Neurological:     General: No focal deficit present.     Mental Status: He is alert and oriented to person, place, and time. Mental status is at baseline.     Sensory: No sensory deficit.     Motor: No weakness.     Coordination: Coordination normal.  Comments: Ambulatory with steady gait     ED Results / Procedures / Treatments   Labs (all labs ordered are listed, but only abnormal results are displayed) Labs Reviewed  COMPREHENSIVE METABOLIC PANEL - Abnormal; Notable for the following components:      Result Value   Glucose, Bld 107 (*)    Calcium 8.4 (*)    Alkaline Phosphatase 191 (*)    All other components within normal limits  CBC WITH DIFFERENTIAL/PLATELET - Abnormal; Notable for the following components:   Hemoglobin 12.8 (*)    All other components within normal limits  LIPASE, BLOOD  ETHANOL  URINALYSIS, ROUTINE W REFLEX MICROSCOPIC  RAPID URINE DRUG SCREEN, HOSP PERFORMED    EKG None  Radiology No results found.  Procedures Procedures    Medications Ordered in ED Medications - No data to display  ED Course/ Medical Decision Making/ A&P                                 Medical Decision  Making Amount and/or Complexity of Data Reviewed Labs: ordered.   [employer] 22 y.o. presented today for abd pain. Working DDx includes, but not limited to, gastroenteritis, colitis, SBO, appendicitis, cholecystitis, hepatobiliary pathology, gastritis, PUD, ACS, dissection, pancreatitis, nephrolithiasis, AAA, UTI, pyelonephritis, ruptured ectopic pregnancy, PID, ovarian  R/o DDx: These are considered less likely than current impression due to history of present illness, physical exam, labs/imaging findings.  Review of prior external notes: none   Pmhx: substance use  Unique Tests and My Interpretation:  CBC, CMP, Lipase, UA, Udrug, EtoH test pending prior to dispo    Imaging:  None, pending labs and repeat exam   Problem List / ED Course / Critical interventions / Medication management  Patient reporting to emergency room with abdominal pain that is been ongoing for 3 to 4 days.   Patient reports pain is mild has been tolerating p.o. intake.  Ordered basic labs, patient does not have distention on exam, no guarding on exam. Reports mild tenderness to upper abdomen. Patient does not endorse any other associated symptoms requesting lab work to be drawn. Patient admits to cocaine use today. Has not tried anything for symptoms. Hemodynamically stable. If patient's labs come back unremarkable and remains hemodynamically stable,  I would recommend p.o. test and anticipate patient will be stable for discharge. No medications ordered, patient declines.    Patients vitals assessed. Upon arrival patient is hemodynamically stable.  I have reviewed the patients home medicines and have made adjustments as needed    Consult: None   Plan:  Sign off to oncoming ED provider prior to labs resulting.  Patient reports he does not want anything for discomfort or nausea at this time, would like to rest and wait for labs.  I anticipate patient will be stable for discharge as long as labs are  within normal limits, p.o. challenge and patient continues to have no change in symptoms throughout stay.          Final Clinical Impression(s) / ED Diagnoses Final diagnoses:  None    Rx / DC Orders ED Discharge Orders     None         Smitty Knudsen, PA-C 11/20/23 2120    Rozelle Logan, DO 11/20/23 2214

## 2023-11-20 NOTE — ED Provider Notes (Signed)
 Patient is a handoff from Barrett, PA-C.  Patient here with concerns of abdominal pain but has been previously seen and there is some concerns for possible malingering.  Patient is currently homeless.  Labs ordered for evaluation of abdominal pain but appears that this is somewhat chronic in nature and patient is currently refusing pain medicine and nausea medicine.  No acute indication for imaging at this time.  Plan is for labs to result and if appropriate, can likely dispo for outpatient follow-up. Physical Exam  BP (!) 126/59   Pulse 99   Temp 98.7 F (37.1 C) (Oral)   Resp 12   Ht 6' (1.829 m)   Wt 62.6 kg   SpO2 97%   BMI 18.72 kg/m   Physical Exam Vitals and nursing note reviewed.  Constitutional:      General: He is not in acute distress.    Appearance: He is not ill-appearing, toxic-appearing or diaphoretic.     Comments: Sleepy, slow to respond. A&O, no slurred speech.   HENT:     Head: Normocephalic and atraumatic.     Mouth/Throat:     Mouth: Mucous membranes are moist.     Pharynx: Oropharynx is clear.  Eyes:     General: No scleral icterus.    Conjunctiva/sclera: Conjunctivae normal.  Cardiovascular:     Rate and Rhythm: Normal rate and regular rhythm.     Pulses: Normal pulses.     Heart sounds: Normal heart sounds.  Pulmonary:     Effort: Pulmonary effort is normal. No respiratory distress.     Breath sounds: Normal breath sounds. No wheezing.     Comments: Normal respiratory effort Chest:     Chest wall: No tenderness.  Abdominal:     General: Abdomen is flat. Bowel sounds are normal.     Palpations: Abdomen is soft.     Tenderness: There is abdominal tenderness.     Comments: Epigastric - no obvious deformity or bruising over area  Musculoskeletal:     Right lower leg: No edema.     Left lower leg: No edema.  Skin:    General: Skin is warm and dry.     Capillary Refill: Capillary refill takes less than 2 seconds.     Coloration: Skin is not jaundiced.      Findings: No lesion.  Neurological:     General: No focal deficit present.     Mental Status: He is alert and oriented to person, place, and time. Mental status is at baseline.     Sensory: No sensory deficit.     Motor: No weakness.     Coordination: Coordination normal.     Comments: Ambulatory with steady gait     Procedures  Procedures  ED Course / MDM    Medical Decision Making Amount and/or Complexity of Data Reviewed Labs: ordered.  Risk OTC drugs.   Patient is a handoff from Barrett, PA-C.  Please see her note for physical exam findings and full HPI.  Patient here with concerns of abdominal pain but has been previously seen and there is some concern for possible malingering.  Patient is currently homeless.  Labs ordered for evaluation of abdominal pain but appears that this is somewhat chronic in nature and patient is currently refusing pain medicine or any nausea medicine.  No acute indication for imaging at this time.   Labs resulted which were unremarkable.  No evidence of leukocytosis but patient's lipase level is unremarkable.  Ethanol negative and  CMP unremarkable.  Patient not giving urine for urinalysis check a UDS screening.  However, patient has remained stable and is only asking for some food and Tylenol at this time.  Given reassuring workup and patient remaining stable, believe that patient is safe for discharge.  Provided patient with information for [place] [facility] to establish care with primary care provider.  Discussed return precautions.  No other acute concerns at this time prior to discharge.    Smitty Knudsen, PA-C 11/20/23 2344    Pricilla Loveless, MD 11/21/23 [phone]

## 2023-11-20 NOTE — Discharge Instructions (Addendum)
You were seen in the ER today for abdominal pain. Your labs were thankfully reassuring. If your symptoms worsen, please return to the ER. Please try to establish care with a primary care provider for further evaluation.

## 2023-11-20 NOTE — ED Triage Notes (Addendum)
Patient reports abdominal pain x 5 days. Denies nausea, vomiting, and diarrhea. Per EMS patient has hx of homelessness and malingering.

## 2023-12-03 ENCOUNTER — Encounter (HOSPITAL_COMMUNITY): Payer: Self-pay | Admitting: *Deleted

## 2023-12-03 ENCOUNTER — Inpatient Hospital Stay (HOSPITAL_COMMUNITY)
Admission: EM | Admit: 2023-12-03 | Discharge: 2023-12-05 | DRG: 603 | Payer: Medicaid Other | Attending: Internal Medicine | Admitting: Internal Medicine

## 2023-12-03 ENCOUNTER — Other Ambulatory Visit: Payer: Self-pay

## 2023-12-03 ENCOUNTER — Emergency Department (HOSPITAL_COMMUNITY): Payer: Medicaid Other

## 2023-12-03 DIAGNOSIS — F609 Personality disorder, unspecified: Secondary | ICD-10-CM | POA: Diagnosis present

## 2023-12-03 DIAGNOSIS — T63301A Toxic effect of unspecified spider venom, accidental (unintentional), initial encounter: Secondary | ICD-10-CM | POA: Diagnosis present

## 2023-12-03 DIAGNOSIS — F1410 Cocaine abuse, uncomplicated: Secondary | ICD-10-CM | POA: Diagnosis present

## 2023-12-03 DIAGNOSIS — S61207A Unspecified open wound of left little finger without damage to nail, initial encounter: Secondary | ICD-10-CM | POA: Diagnosis present

## 2023-12-03 DIAGNOSIS — F909 Attention-deficit hyperactivity disorder, unspecified type: Secondary | ICD-10-CM | POA: Diagnosis present

## 2023-12-03 DIAGNOSIS — Z5900 Homelessness unspecified: Secondary | ICD-10-CM | POA: Diagnosis not present

## 2023-12-03 DIAGNOSIS — Z9152 Personal history of nonsuicidal self-harm: Secondary | ICD-10-CM

## 2023-12-03 DIAGNOSIS — F32A Depression, unspecified: Secondary | ICD-10-CM | POA: Diagnosis not present

## 2023-12-03 DIAGNOSIS — Z79899 Other long term (current) drug therapy: Secondary | ICD-10-CM

## 2023-12-03 DIAGNOSIS — F1210 Cannabis abuse, uncomplicated: Secondary | ICD-10-CM | POA: Diagnosis present

## 2023-12-03 DIAGNOSIS — G4700 Insomnia, unspecified: Secondary | ICD-10-CM | POA: Diagnosis present

## 2023-12-03 DIAGNOSIS — B9562 Methicillin resistant Staphylococcus aureus infection as the cause of diseases classified elsewhere: Secondary | ICD-10-CM | POA: Diagnosis present

## 2023-12-03 DIAGNOSIS — Z888 Allergy status to other drugs, medicaments and biological substances status: Secondary | ICD-10-CM | POA: Diagnosis not present

## 2023-12-03 DIAGNOSIS — F17210 Nicotine dependence, cigarettes, uncomplicated: Secondary | ICD-10-CM | POA: Diagnosis present

## 2023-12-03 DIAGNOSIS — Z560 Unemployment, unspecified: Secondary | ICD-10-CM | POA: Diagnosis not present

## 2023-12-03 DIAGNOSIS — F1290 Cannabis use, unspecified, uncomplicated: Secondary | ICD-10-CM | POA: Diagnosis present

## 2023-12-03 DIAGNOSIS — F419 Anxiety disorder, unspecified: Secondary | ICD-10-CM | POA: Diagnosis present

## 2023-12-03 DIAGNOSIS — L02818 Cutaneous abscess of other sites: Secondary | ICD-10-CM | POA: Diagnosis not present

## 2023-12-03 DIAGNOSIS — F319 Bipolar disorder, unspecified: Secondary | ICD-10-CM | POA: Diagnosis present

## 2023-12-03 DIAGNOSIS — F4310 Post-traumatic stress disorder, unspecified: Secondary | ICD-10-CM | POA: Diagnosis present

## 2023-12-03 DIAGNOSIS — L02414 Cutaneous abscess of left upper limb: Secondary | ICD-10-CM | POA: Diagnosis not present

## 2023-12-03 DIAGNOSIS — W57XXXA Bitten or stung by nonvenomous insect and other nonvenomous arthropods, initial encounter: Secondary | ICD-10-CM | POA: Diagnosis present

## 2023-12-03 DIAGNOSIS — L02512 Cutaneous abscess of left hand: Secondary | ICD-10-CM | POA: Diagnosis present

## 2023-12-03 DIAGNOSIS — F1490 Cocaine use, unspecified, uncomplicated: Secondary | ICD-10-CM | POA: Diagnosis present

## 2023-12-03 DIAGNOSIS — M659 Unspecified synovitis and tenosynovitis, unspecified site: Secondary | ICD-10-CM | POA: Diagnosis present

## 2023-12-03 DIAGNOSIS — L0291 Cutaneous abscess, unspecified: Secondary | ICD-10-CM | POA: Diagnosis present

## 2023-12-03 LAB — CBC WITH DIFFERENTIAL/PLATELET
Abs Immature Granulocytes: 0.03 10*3/uL (ref 0.00–0.07)
Basophils Absolute: 0 10*3/uL (ref 0.0–0.1)
Basophils Relative: 0 %
Eosinophils Absolute: 0.1 10*3/uL (ref 0.0–0.5)
Eosinophils Relative: 1 %
HCT: 43.4 % (ref 39.0–52.0)
Hemoglobin: 13.8 g/dL (ref 13.0–17.0)
Immature Granulocytes: 0 %
Lymphocytes Relative: 21 %
Lymphs Abs: 1.9 10*3/uL (ref 0.7–4.0)
MCH: 27 pg (ref 26.0–34.0)
MCHC: 31.8 g/dL (ref 30.0–36.0)
MCV: 84.9 fL (ref 80.0–100.0)
Monocytes Absolute: 0.9 10*3/uL (ref 0.1–1.0)
Monocytes Relative: 10 %
Neutro Abs: 6.1 10*3/uL (ref 1.7–7.7)
Neutrophils Relative %: 68 %
Platelets: 234 10*3/uL (ref 150–400)
RBC: 5.11 MIL/uL (ref 4.22–5.81)
RDW: 13 % (ref 11.5–15.5)
WBC: 9 10*3/uL (ref 4.0–10.5)
nRBC: 0 % (ref 0.0–0.2)

## 2023-12-03 LAB — SEDIMENTATION RATE: Sed Rate: 5 mm/h (ref 0–16)

## 2023-12-03 LAB — BASIC METABOLIC PANEL
Anion gap: 8 (ref 5–15)
BUN: 7 mg/dL (ref 6–20)
CO2: 27 mmol/L (ref 22–32)
Calcium: 8.9 mg/dL (ref 8.9–10.3)
Chloride: 101 mmol/L (ref 98–111)
Creatinine, Ser: 0.66 mg/dL (ref 0.61–1.24)
GFR, Estimated: >60 mL/min (ref >60)
Glucose, Bld: 62 mg/dL — ABNORMAL LOW (ref 70–99)
Potassium: 3.6 mmol/L (ref 3.5–5.1)
Sodium: 136 mmol/L (ref 135–145)

## 2023-12-03 LAB — HIV ANTIBODY (ROUTINE TESTING W REFLEX): HIV Screen 4th Generation wRfx: NONREACTIVE

## 2023-12-03 LAB — C-REACTIVE PROTEIN: CRP: 1.3 mg/dL — ABNORMAL HIGH (ref <1.0)

## 2023-12-03 MED ORDER — BACITRACIN ZINC 500 UNIT/GM EX OINT
TOPICAL_OINTMENT | Freq: Three times a day (TID) | CUTANEOUS | Status: DC
  Administered 2023-12-03 – 2023-12-04 (×2): 31.5 via TOPICAL
  Filled 2023-12-03: qty 28.4

## 2023-12-03 MED ORDER — PIPERACILLIN-TAZOBACTAM 3.375 G IVPB
3.3750 g | Freq: Once | INTRAVENOUS | Status: AC
  Administered 2023-12-03: 3.375 g via INTRAVENOUS
  Filled 2023-12-03: qty 50

## 2023-12-03 MED ORDER — VANCOMYCIN HCL IN DEXTROSE 1-5 GM/200ML-% IV SOLN
1000.0000 mg | Freq: Two times a day (BID) | INTRAVENOUS | Status: DC
  Administered 2023-12-03 – 2023-12-04 (×3): 1000 mg via INTRAVENOUS
  Filled 2023-12-03 (×3): qty 200

## 2023-12-03 MED ORDER — ONDANSETRON HCL 4 MG/2ML IJ SOLN
4.0000 mg | Freq: Once | INTRAMUSCULAR | Status: AC
  Administered 2023-12-03: 4 mg via INTRAVENOUS
  Filled 2023-12-03: qty 2

## 2023-12-03 MED ORDER — MORPHINE SULFATE (PF) 4 MG/ML IV SOLN
4.0000 mg | Freq: Once | INTRAVENOUS | Status: AC
  Administered 2023-12-03: 4 mg via INTRAVENOUS
  Filled 2023-12-03: qty 1

## 2023-12-03 MED ORDER — NICOTINE 21 MG/24HR TD PT24
21.0000 mg | MEDICATED_PATCH | Freq: Every day | TRANSDERMAL | Status: DC
  Administered 2023-12-03 – 2023-12-05 (×3): 21 mg via TRANSDERMAL
  Filled 2023-12-03 (×3): qty 1

## 2023-12-03 MED ORDER — RISPERIDONE 0.5 MG PO TABS
0.5000 mg | ORAL_TABLET | Freq: Every day | ORAL | Status: DC
  Administered 2023-12-03 – 2023-12-04 (×2): 0.5 mg via ORAL
  Filled 2023-12-03 (×3): qty 1

## 2023-12-03 MED ORDER — ENOXAPARIN SODIUM 40 MG/0.4ML IJ SOSY: 40.0000 mg | PREFILLED_SYRINGE | INTRAMUSCULAR | Status: DC

## 2023-12-03 MED ORDER — OXYCODONE HCL 5 MG PO TABS
5.0000 mg | ORAL_TABLET | ORAL | Status: DC | PRN
  Administered 2023-12-03 – 2023-12-04 (×6): 5 mg via ORAL
  Filled 2023-12-03 (×6): qty 1

## 2023-12-03 MED ORDER — ACETAMINOPHEN 500 MG PO TABS
1000.0000 mg | ORAL_TABLET | Freq: Four times a day (QID) | ORAL | Status: DC
  Administered 2023-12-03 – 2023-12-05 (×7): 1000 mg via ORAL
  Filled 2023-12-03 (×8): qty 2

## 2023-12-03 MED ORDER — SERTRALINE HCL 50 MG PO TABS
50.0000 mg | ORAL_TABLET | Freq: Every day | ORAL | Status: DC
  Administered 2023-12-03 – 2023-12-05 (×3): 50 mg via ORAL
  Filled 2023-12-03 (×3): qty 1

## 2023-12-03 MED ORDER — LIDOCAINE-EPINEPHRINE 1 %-1:100000 IJ SOLN
10.0000 mL | Freq: Once | INTRAMUSCULAR | Status: AC
  Administered 2023-12-03: 10 mL via INTRADERMAL
  Filled 2023-12-03: qty 1

## 2023-12-03 MED ORDER — IBUPROFEN 400 MG PO TABS
800.0000 mg | ORAL_TABLET | Freq: Three times a day (TID) | ORAL | Status: DC
  Administered 2023-12-03 – 2023-12-05 (×6): 800 mg via ORAL
  Filled 2023-12-03 (×6): qty 2
  Filled 2023-12-03: qty 1

## 2023-12-03 MED ORDER — VANCOMYCIN HCL 1250 MG/250ML IV SOLN
1250.0000 mg | Freq: Once | INTRAVENOUS | Status: AC
  Administered 2023-12-03: 1250 mg via INTRAVENOUS
  Filled 2023-12-03: qty 250

## 2023-12-03 NOTE — ED Provider Notes (Signed)
[place] EMERGENCY DEPARTMENT AT [facility] Provider Note   CSN: [id number] Arrival date & time: 12/03/23  0746     History  Chief Complaint  Patient presents with   Finger Injury    William Macdonald is a 22 y.o. male with a PMH of polysubstance use, depression who presents to the ED for left fifth finger pain and swelling.  Patient reports he noticed pain in his left pinky finger when he was fingerprinted getting booked into jail approximately 1 week ago, states over the past 2 to 3 days, it has become increasingly swollen and red.  Reports he has been taking Keflex and clindamycin in jail but has not been helping.  He states he is homeless and thinks he may have gotten bit by a spider, but does not know if he had a specific injury to it.  Denies fevers, endorses occasional chills.  Denies drainage from the wound.  Denies any other concerns today including pain in any other extremity, chest pain, abdominal pain.  HPI     Home Medications Prior to Admission medications   Medication Sig Start Date End Date Taking? Authorizing Provider  cephALEXin (KEFLEX) 500 MG capsule Take 500 mg by mouth 4 (four) times daily.   Yes [provider]  ibuprofen (ADVIL) 800 MG tablet Take 800 mg by mouth in the morning and at bedtime.   Yes [provider]  sertraline (ZOLOFT) 50 MG tablet Take 50 mg by mouth daily.   Yes [provider]  acyclovir (ZOVIRAX) 400 MG tablet Take 1 tablet (400 mg total) by mouth 2 (two) times daily. Patient not taking: Reported on 12/03/2023 10/04/23   Bethann Berkshire, MD      Allergies    Zyprexa [olanzapine] and Trileptal [oxcarbazepine]    Review of Systems   Review of Systems  Physical Exam Updated Vital Signs Ht 5\' 8"  (1.727 m)   Wt 59 kg   BMI 19.77 kg/m  Physical Exam Constitutional:      General: He is not in acute distress.    Appearance: Normal appearance. He is not ill-appearing.  HENT:     Head:  Normocephalic and atraumatic.     Nose: Nose normal.     Mouth/Throat:     Mouth: Mucous membranes are moist.     Pharynx: Oropharynx is clear.  Eyes:     Pupils: Pupils are equal, round, and reactive to light.  Cardiovascular:     Rate and Rhythm: Normal rate and regular rhythm.     Heart sounds: Normal heart sounds. No murmur heard.    No friction rub. No gallop.  Pulmonary:     Breath sounds: Normal breath sounds. No stridor. No wheezing, rhonchi or rales.  Abdominal:     Palpations: Abdomen is soft.     Tenderness: There is no abdominal tenderness. There is no guarding or rebound.  Musculoskeletal:     Cervical back: Normal range of motion and neck supple.     Right lower leg: No edema.     Left lower leg: No edema.     Comments: Left 5th finger held in slight flexion with fusiform swelling and redness, tenderness proximally down the hand. Pain with active and passive flexion and extension. Small wound overlying that does not appear to be draining. Sensation intact distally, cap refill <2 sec. Bilateral radial pulses 2+  Skin:    General: Skin is warm and dry.     Capillary Refill: Capillary refill  takes less than 2 seconds.  Neurological:     General: No focal deficit present.     Mental Status: He is alert.     ED Results / Procedures / Treatments   Labs (all labs ordered are listed, but only abnormal results are displayed) Labs Reviewed  BASIC METABOLIC PANEL - Abnormal; Notable for the following components:      Result Value   Glucose, Bld 62 (*)    All other components within normal limits  C-REACTIVE PROTEIN - Abnormal; Notable for the following components:   CRP 1.3 (*)    All other components within normal limits  CBC WITH DIFFERENTIAL/PLATELET  SEDIMENTATION RATE    EKG None  Radiology DG Hand 2 View Left  Result Date: 12/03/2023 CLINICAL DATA:  MC-DGconcern for FTS eft 4th finger is swelling and questionable spider bite, states he was incarcerated  11/29 , was found in a car had been using drugs and doesn't know if he injuried EXAM: LEFT HAND - 2 VIEW COMPARISON:  None Available. FINDINGS: There is soft tissue swelling of the fifth digit. No foreign body identified. No fracture or osseous erosion. IMPRESSION: Soft tissue swelling of the fifth digit. No foreign body or osseous abnormality. Electronically Signed   By: Genevive Bi M.D.   On: 12/03/2023 08:27    Procedures Procedures    Medications Ordered in ED Medications  lidocaine-EPINEPHrine (XYLOCAINE W/EPI) 1 %-1:100000 (with pres) injection 10 mL (has no administration in time range)  vancomycin (VANCOREADY) IVPB 1250 mg/250 mL (1,250 mg Intravenous New Bag/Given 12/03/23 0912)  piperacillin-tazobactam (ZOSYN) IVPB 3.375 g (0 g Intravenous Stopped 12/03/23 0910)  morphine (PF) 4 MG/ML injection 4 mg (4 mg Intravenous Given 12/03/23 0830)  ondansetron (ZOFRAN) injection 4 mg (4 mg Intravenous Given 12/03/23 0830)    ED Course/ Medical Decision Making/ A&P                                 Medical Decision Making Amount and/or Complexity of Data Reviewed Labs: ordered. Radiology: ordered.  Risk Prescription drug management. Decision regarding hospitalization.   Vital signs stable, physical exam concerning for flexor tenosynovitis of the left fifth digit.  Hand surgery was immediately consulted for evaluation and IV antibiotics of vancomycin and Zosyn were initiated.  CBC with no leukocytosis or anemia.  Metabolic panel with no gross metabolic or electrolyte abnormality.  CRP 1.3, ESR normal.  X-ray with soft tissue swelling and no fracture, foreign body, or osteomyelitis seen.  Hand surgery trained to the fluid collection at bedside and recommended admission to medicine for further management.        Final Clinical Impression(s) / ED Diagnoses Final diagnoses:  Flexor tenosynovitis of finger    Rx / DC Orders ED Discharge Orders     None         Janyth Pupa, MD 12/03/23 1246    Gerhard Munch, MD 12/03/23 1526

## 2023-12-03 NOTE — H&P (Addendum)
Date: 12/03/2023               Patient Name:  William Macdonald MRN: [id number]  DOB: [birth year] Age / Sex: 22 y.o., male   PCP: Pcp, No         Medical Service: Internal Medicine Teaching Service         Attending Physician: Dr. Gerhard Munch, MD      First Contact: Dr. Jeral Pinch, DO Pager [phone]    Second Contact: Dr. Rana Snare, DO Pager [phone]         After Hours (After 5p/  First Contact Pager: [phone]  weekends / holidays): Second Contact Pager: [phone]   SUBJECTIVE   Chief Complaint: left finger infection  History of Present Illness:  [name] is a 22 year old with past medical history of PTSD, personality disorder and depression with psychotic features presenting from jail with 4-day history of left fifth finger wound.  He reports that he noted a spider bite on that finger about 4 days ago.  Area has gradually become more painful, swollen and he has difficulty bending his fingers now.  So endorses fevers and chills for the last several days.  In jail he had been started on Keflex a few days ago without improvement in hand.  He went to jail on November 29 and will be there until January.   Other concern is about his mental health.  He was admitted into an inpatient psych unit in [place] several years ago and diagnosed with PTSD, personality disorders and depression.  He is upset that medications were not continued at jail.  He is unable to tell me what medications he is on outside of sertraline.  ED Course: Glucose 62, afebrile  Started on zosyn and vancomycin. Ortho consulted  Past Medical History Depression with psychotic features Personality disorder  Past Surgical History:  Procedure Laterality Date   TONSILLECTOMY      Meds:  Current Meds  Medication Sig   cephALEXin (KEFLEX) 500 MG capsule Take 500 mg by mouth 4 (four) times daily.   ibuprofen (ADVIL) 800 MG tablet Take 800 mg by mouth in the morning and at bedtime.   sertraline  (ZOLOFT) 50 MG tablet Take 50 mg by mouth daily.    Social:  Lives With: On home, states that he is from [place] Occupation: unemployed Level of Function: independent in ADLs.  PCP: none Substances: history of opioid use disorder   Allergies: Allergies as of 12/03/2023 - Review Complete 12/03/2023  Allergen Reaction Noted   Zyprexa [olanzapine] Other (See Comments) 05/19/2023   Trileptal [oxcarbazepine]  12/03/2023    Review of Systems: A complete ROS was negative except as per HPI.   OBJECTIVE:   Physical Exam: Height 5\' 8"  (1.727 m), weight 59 kg.  Constitutional: well-appearing, in no acute distress Cardiovascular: regular rate and rhythm, no m/r/g Pulmonary/Chest: normal work of breathing on room air, lungs clear to auscultation bilaterally Abdominal: soft, non-tender, non-distended MSK: Left hand is wrapped in gauze and packing from recent bedside I&D procedure, patient is able to flex and extend other fingers on his left hand but is unable to move fifth finger, and the finger is warm and well-perfused Skin: warm and dry  Labs: CBC White blood cell count of 9  CMP  No abnormalities Sed rate 5 CRP 1.3  Imaging: IMPRESSION: Soft tissue swelling of the fifth digit. No foreign body or osseous abnormality.   ASSESSMENT & PLAN:   Assessment & Plan by Problem:  Active Problems:   * No active hospital problems. *   JASTEN Macdonald is a 22 y.o. person living with a history of DSD, personality disorder, social determinants of health who presented with left fifth finger abscess and admitted for left fifth finger abscess on hospital day 0  Left 5th finger abscess Patient presents with 4 days of worsening swelling edema and erythema to his left fifth finger.  He thinks that he had a insect bite on the palmar surface of his hand.  He was treated with doxycycline at jail without improvement.  He endorses systemic symptoms with nausea and chills for the last few  days.  Ortho saw him in the emergency room and did a bedside I&D with purulent drainage.  He was given Zosyn and vancomycin in the ED.  Will continue vancomycin to cover for MRSA.  Packing to be removed this evening by bedside RN.  -Continue vancomycin per pharmacy -Remove packing this evening -PT/ OT -Scheduled Tylenol 1000 mg 4 times daily, ibuprofen 800 mg 3 times daily, oxycodone 5 mg every 4 hours as needed -Okay with IV pain medications if really indicated, would try all p.o. options first -follow cultures  Mood disorder PTSD Social determinants of health He is currently in jail from November 29 until early January.  He is very concerned about his medications and present as he was on others from an inpatient psych admission several years ago.  He is not able to tell me which medications he was on.  He is currently taking Zoloft.  -Appreciate psychiatry evaluation -Restart Zoloft 50 mg  Diet: Normal VTE: Enoxaparin, if he has to stay here an additional day would consider adding DOAC if patient is not open to injections IVF: None,None Code: Full  Prior to Admission Living Arrangement: Jail Anticipated Discharge Location: Jail Barriers to Discharge: Left fifth finger abscess and need for IV antibiotics  Dispo: Admit patient to Inpatient with expected length of stay greater than 2 midnights.  Signed: Rudene Christians, DO Internal Medicine Resident PGY-3  12/03/2023, 12:24 PM

## 2023-12-03 NOTE — Consult Note (Signed)
William Macdonald Psychiatry Consult Evaluation  Service Date: December 03, 2023 LOS:  LOS: 0 days    Primary Psychiatric Diagnoses  Bipolar disorder, per history 2.  Cocaine use disorder 3.  Cannabis use disorder  Assessment  William Macdonald is a 22 y.o. male admitted medically for 12/03/2023  7:46 AM for injury of L finger wound. He carries the psychiatric diagnoses of Bipolar disorder  . Psychiatry was consulted for med adjustment.  Per patient, he wanted to get back on his psychiatric medications. He is currently incarcerated until January.  Patient seen at bed side; with the guard present in the room. William Macdonald tells me that he is diagnosed with Bipolar d/o, PTSD, anxiety and insomnia. Four months ago, he decided to stop all his meds - Zoloft, Risperidone, Clonidine, "other medications I forgot what they are".  He previously has hospitalization at [facility] which prescribed his medications.    Tells me that he is currently struggling with "poor sleep" and "poor mood". Denied SI. Had SIB as a teenager when he cut himself. Denied HI. Tells me that he needs a mood stabilizer to help with his "mood and rage" issues. Currently denies any VI; tells me "I keep it to myself"  Discussed options. He agreed to continue Zoloft 50 mg; he understands that it will take a while before he can notice improvement in his mood and anxiety. He agreed to restart Risperidone 0.5 mg for mood.  I discussed metabolic syndrome with SGA; he requested literature about it.  Diagnoses:  Active Hospital problems: Principal Problem:   Skin abscess     Plan   Patient is requesting to restart his medications. Given that this is a short hospital stay, and that he will likely see a psychiatrist in the jail, will be conservative regarding medications. Will continue Zoloft 50 mg (needs more time to see effect). Will restart Risperidone 0.5 mg.     ## Psychiatric Medication Recommendations:  -- Continue Zoloft 50 mg --  Start Risperidone 0.5 mg qhs -- Literature on metabolic syndrome of second generation antipsychotic  ## Medical Decision Making Capacity:  Intact  ## Further Work-up:  -- none   ## Safety and Observation Level:  - Based on my clinical evaluation, I estimate the patient to be at low risk of self harm in the current setting This decision is based on my review of the chart including patient's history and current presentation, interview of the patient, mental status examination, and consideration of suicide risk including evaluating suicidal ideation, plan, intent, suicidal or self-harm behaviors, risk factors, and protective factors. This judgment is based on our ability to directly address suicide risk, implement suicide prevention strategies and develop a safety plan while the patient is in the clinical setting. Please contact our team if there is a concern that risk level has changed.  Suicide risk assessment  Patient has following modifiable risk factors for suicide: medication noncompliance, which we are addressing by restarting medications   Patient has following non-modifiable or demographic risk factors for suicide: male gender, history of self harm behavior, and psychiatric hospitalization  Patient has the following protective factors against suicide: Access to outpatient mental health care, Supportive family, and no history of suicide attempts   Thank you for this consult request. Recommendations have been communicated to the primary team.  We will sign off at this time.   Stark Klein, MD  Psychiatric and Social History   Relevant Aspects of Hospital Course:  Reports a diagnosis  of Bipolar, ADHD, PTSD, anxiety and   Had hospitalizations in the past for dual diagnosis.  Currently incarcerated  Substance History Tobacco use: denied Alcohol use: occasional per patient Drug use: Regular cannabis and cocaine user; previously used LSD.    Exam Findings   Psychiatric  Specialty Exam:  Presentation  General Appearance: Casual  Eye Contact:Fleeting  Speech:Clear and Coherent; Normal Rate  Speech Volume:Normal  Handedness:Right   Mood and Affect  Mood:Euthymic  Affect:Congruent   Thought Process  Thought Processes:Coherent; Goal Directed  Descriptions of Associations:Intact  Orientation:Full (Time, Place and Person)  Thought Content:Logical  Hallucinations:none Ideas of Reference:None  Suicidal Thoughts:denied Homicidal Thoughts:denied  Sensorium  Memory:Immediate Good; Recent Good; Remote Good  Judgment:Intact  Insight:Fair   Executive Functions  Concentration:Fair  Attention Span:Fair  Recall:Good  Fund of Knowledge:Good  Language:Good   Psychomotor Activity  Psychomotor Activity:No data recorded  Assets  Assets:Communication Skills; Desire for Improvement   Sleep  Sleep:No data recorded   Physical Exam: Vital signs:  Temp:  [97.9 F (36.6 C)] 97.9 F (36.6 C) (12/06 1341) Pulse Rate:  [49-77] 77 (12/06 1341) Resp:  [18] 18 (12/06 1341) BP: (116-131)/(68) 131/68 (12/06 1341) SpO2:  [100 %] 100 % (12/06 1341) Weight:  [59 kg] 59 kg (12/06 0811)   Blood pressure 131/68, pulse 77, temperature 97.9 F (36.6 C), temperature source Oral, resp. rate 18, height 5\' 8"  (1.727 m), weight 59 kg, SpO2 100%. Body mass index is 19.77 kg/m.   Other History   These have been pulled in through the EMR, reviewed, and updated if appropriate.   Family History:    Medical History: History reviewed. No pertinent past medical history.  Surgical History: Past Surgical History:  Procedure Laterality Date  . TONSILLECTOMY      Medications:   Current Facility-Administered Medications:  .  acetaminophen (TYLENOL) tablet 1,000 mg, 1,000 mg, Oral, Q6H, Masters, Katie, DO, 1,000 mg at 12/03/23 1256 .  Wound care, , , TID **AND** bacitracin ointment, , Topical, TID, Mack Hook, MD, Given at 12/03/23 1133 .   enoxaparin (LOVENOX) injection 40 mg, 40 mg, Subcutaneous, Q24H, Masters, Katie, DO .  ibuprofen (ADVIL) tablet 800 mg, 800 mg, Oral, TID, Masters, Katie, DO, 800 mg at 12/03/23 1257 .  oxyCODONE (Oxy IR/ROXICODONE) immediate release tablet 5 mg, 5 mg, Oral, Q4H PRN, Masters, Katie, DO, 5 mg at 12/03/23 1401 .  sertraline (ZOLOFT) tablet 50 mg, 50 mg, Oral, Daily, Masters, Katie, DO .  vancomycin (VANCOCIN) IVPB 1000 mg/200 mL premix, 1,000 mg, Intravenous, Q12H, Daylene Posey, RPH  Allergies: Allergies  Allergen Reactions  . Zyprexa [Olanzapine] Other (See Comments)    Muscle spams  . Trileptal [Oxcarbazepine]     Leg spasms and muscle tension

## 2023-12-03 NOTE — Progress Notes (Signed)
Pharmacy Antibiotic Note  William Macdonald is a 22 y.o. male admitted on 12/03/2023 presenting with finger infection.  Pharmacy has been consulted for vancomycin dosing.  Vanc 1250 mg Iv x 1 given in ED  Plan: Vancomycin 1g IV q 12h (eAUC 499) Monitor renal function, clinical progression and ability to narrow  Height: 5\' 8"  (172.7 cm) Weight: 59 kg (130 lb) IBW/kg (Calculated) : 68.4  No data recorded.  Recent Labs  Lab 12/03/23 0825  WBC 9.0  CREATININE 0.66    Estimated Creatinine Clearance: 121.9 mL/min (by C-G formula based on SCr of 0.66 mg/dL).    Allergies  Allergen Reactions   Zyprexa [Olanzapine] Other (See Comments)    Muscle spams   Trileptal [Oxcarbazepine]     Leg spasms and muscle tension    Daylene Posey, PharmD, [facility] Clinical Pharmacist ED Pharmacist Phone # [phone] 12/03/2023 12:53 PM

## 2023-12-03 NOTE — ED Triage Notes (Signed)
Patient presents to ed via [facility] sheriff from the jail states his left 4th finger is swelling and questionable spider bite, states he was incarcerated 11/29 , was found in a car had been using drugs and doesn't know if he injuried his finger however states he is also homeless and thinks it could be a spider bite. Was started on antibiotics 3 days ago

## 2023-12-03 NOTE — Plan of Care (Signed)

## 2023-12-03 NOTE — Consult Note (Signed)
ORTHOPAEDIC CONSULTATION HISTORY & PHYSICAL REQUESTING PHYSICIAN: Gerhard Munch, MD  Chief Complaint: left little finger pain/swelling  HPI: William Macdonald is a 22 y.o. male who presents for evaluation from [facility] jail.  He reports that over the past 3 days he has developed a blisterlike lesion on the volar radial surface of the middle phalanx of the left small finger.  He cannot recall a specific breach in the skin.  The finger is now swollen, reddened, and painful to palpation or movement.  He reports will be incarcerated at least through January 14, which is his court date.  History reviewed. No pertinent past medical history. Past Surgical History:  Procedure Laterality Date   TONSILLECTOMY     Social History   Socioeconomic History   Marital status: Single    Spouse name: Not on file   Number of children: Not on file   Years of education: Not on file   Highest education level: Not on file  Occupational History   Not on file  Tobacco Use   Smoking status: Every Day    Types: Cigarettes    Passive exposure: Yes   Smokeless tobacco: Never  Substance and Sexual Activity   Alcohol use: Yes    Comment: occ   Drug use: Yes    Types: Cocaine    Comment: Heroin   Sexual activity: Not on file  Other Topics Concern   Not on file  Social History Narrative   Not on file   Social Determinants of Health   Financial Resource Strain: Not on file  Food Insecurity: Not on file  Transportation Needs: Not on file  Physical Activity: Not on file  Stress: Not on file  Social Connections: Unknown (04/27/2022)   Received from [facility]   Social Network    Social Network: Not on file   History reviewed. No pertinent family history. Allergies  Allergen Reactions   Zyprexa [Olanzapine] Other (See Comments)    Muscle spams   Prior to Admission medications   Medication Sig Start Date End Date Taking? Authorizing Provider  acyclovir (ZOVIRAX) 400 MG  tablet Take 1 tablet (400 mg total) by mouth 2 (two) times daily. 10/04/23   Bethann Berkshire, MD  cetirizine HCl (ZYRTEC) 1 MG/ML solution Take 5 mg by mouth daily. Patient not taking: Reported on 10/04/2023 09/30/23   [provider]   DG Hand 2 View Left  Result Date: 12/03/2023 CLINICAL DATA:  MC-DGconcern for FTS eft 4th finger is swelling and questionable spider bite, states he was incarcerated 11/29 , was found in a car had been using drugs and doesn't know if he injuried EXAM: LEFT HAND - 2 VIEW COMPARISON:  None Available. FINDINGS: There is soft tissue swelling of the fifth digit. No foreign body identified. No fracture or osseous erosion. IMPRESSION: Soft tissue swelling of the fifth digit. No foreign body or osseous abnormality. Electronically Signed   By: Genevive Bi M.D.   On: 12/03/2023 08:27    Positive ROS: All other systems have been reviewed and were otherwise negative with the exception of those mentioned in the HPI and as above.  Physical Exam: Vitals: Refer to EMR. Constitutional:  WD, WN, NAD HEENT:  NCAT, EOMI Neuro/Psych:  Alert & oriented to person, place, and time; appropriate mood & affect Lymphatic: No generalized extremity edema or lymphadenopathy Extremities / MSK:  The extremities are normal with respect to appearance, ranges of motion, joint stability, muscle strength/tone, sensation, & perfusion except  as otherwise noted:  Left small finger is swollen, slightly reddened, extending from a blisterlike lesion on the volar radial aspect overlying the middle phalanx.  Surrounding it is some flocculent intradermal extension of abscess.  The digit rests in full extension except for very slight PIP flexed posture.  Passive movement of the digit is somewhat pain provoking.  There is no significant pain or swelling in the palm and more proximally along the flexor tendon of the small finger.  CRP 1.3, ESR 5, WBC 9  Assessment: Left small finger abscess,  intradermal versus subcutaneous with surrounding cellulitis.  Low index of suspicion for infectious flexor tenosynovitis  Plan/Procedure: These findings were reviewed with him and verbal consent obtained to perform bedside evaluation/I&D.  Digital block was performed by me with lidocaine bearing epinephrine.  The digit was prepped with Betadine and draped in standard fashion.  After an adequate degree of anesthesia been obtained, the epidermis at the main lesion site was opened with spreading dissection.  Purulent material spreading fourth and cultures were obtained.  The epidermis was then debrided.  This revealed a 1 cm open area on the volar surface of the middle phalanx of dissolved dermis.  There was a subcutaneous cavity beneath this, and it did not extend beyond the confines of the middle phalanx region.  This was copiously irrigated and a moistened gauze placed as a packing to help keep the wound edges open over the next several hours.  A dressing was applied.  Further recommendations: 1.  Medical admission for further infection care, including parenteral antibiotics and host factor optimization, would likely benefit from 24 to 48 hours, guided by clinical improvement before converted to oral antibiotics (guided by cultures)  2.  Nursing wound care--I will provide orders for removing the packing this evening, beginning warm soapy water soaks 3 times daily followed by application of new dressings with bacitracin ointment placed into the open wound  3.  Encourage range of motion exercises to prevent permanent stiffness  4.  Wound will probably take 2 to 3 weeks to fully heal-in and establish full re-epithelialization.    5. Patient is welcome to follow-up with me as an outpatient if there is any concern regarding the healing of the wound or decreased motion, and of course, I remain available as needed for any surgical/functional questions or concerns that may arise during this  hospitalization.   Cliffton Asters Janee Morn, MD      Orthopaedic & Hand Surgery [facility] Orthopaedic & Sports Medicine [facility] [address] [place], [place]  [address] Office: [phone]  12/03/2023, 10:45 AM

## 2023-12-03 NOTE — ED Notes (Signed)
ED TO INPATIENT HANDOFF REPORT  ED Nurse Name and Phone #: [phone]  S Name/Age/Gender William Macdonald 22 y.o. male Room/Bed: TRAAC/TRAAC  Code Status   Code Status: Full Code  Home/SNF/Other jAIL Patient oriented to: self, place, time, and situation Is this baseline? Yes   Triage Complete: Triage complete  Chief Complaint Skin abscess [L02.91]  Triage Note Patient presents to ed via [facility] sheriff from the jail states his left 4th finger is swelling and questionable spider bite, states he was incarcerated 11/29 , was found in a car had been using drugs and doesn't know if he injuried his finger however states he is also homeless and thinks it could be a spider bite. Was started on antibiotics 3 days ago      Allergies Allergies  Allergen Reactions   Zyprexa [Olanzapine] Other (See Comments)    Muscle spams   Trileptal [Oxcarbazepine]     Leg spasms and muscle tension    Level of Care/Admitting Diagnosis ED Disposition     ED Disposition  Admit   Condition  --   Comment  Hospital Area: [facility] [[id number]]  Level of Care: Med-Surg [16]  May admit patient to [facility] or [facility] if equivalent level of care is available:: No  Covid Evaluation: Asymptomatic - no recent exposure (last 10 days) testing not required  Diagnosis: Skin abscess [253664]  Admitting Physician: Silvio Pate  Attending Physician: Gust Rung [[id number]]  Certification:: I certify this patient will need inpatient services for at least 2 midnights  Expected Medical Readiness: 12/05/2023          B Medical/Surgery History History reviewed. No pertinent past medical history. Past Surgical History:  Procedure Laterality Date   TONSILLECTOMY       A IV Location/Drains/Wounds Patient Lines/Drains/Airways Status     Active Line/Drains/Airways     Name Placement date Placement time Site Days   Peripheral IV 12/03/23 20 G Anterior;Proximal;Right Forearm  12/03/23  0823  Forearm  less than 1            Intake/Output Last 24 hours No intake or output data in the 24 hours ending 12/03/23 1307  Labs/Imaging Results for orders placed or performed during the hospital encounter of 12/03/23 (from the past 48 hour(s))  Basic metabolic panel     Status: Abnormal   Collection Time: 12/03/23  8:25 AM  Result Value Ref Range   Sodium 136 135 - 145 mmol/L   Potassium 3.6 3.5 - 5.1 mmol/L   Chloride 101 98 - 111 mmol/L   CO2 27 22 - 32 mmol/L   Glucose, Bld 62 (L) 70 - 99 mg/dL    Comment: Glucose reference range applies only to samples taken after fasting for at least 8 hours.   BUN 7 6 - 20 mg/dL   Creatinine, Ser 4.03 0.61 - 1.24 mg/dL   Calcium 8.9 8.9 - 47.4 mg/dL   GFR, Estimated >25 >95 mL/min    Comment: (NOTE) Calculated using the CKD-EPI Creatinine Equation (2021)    Anion gap 8 5 - 15    Comment: Performed at [facility], [address], [place], [place] [address]  CBC with Differential     Status: None   Collection Time: 12/03/23  8:25 AM  Result Value Ref Range   WBC 9.0 4.0 - 10.5 K/uL   RBC 5.11 4.22 - 5.81 MIL/uL   Hemoglobin 13.8 13.0 - 17.0 g/dL   HCT  43.4 39.0 - 52.0 %   MCV 84.9 80.0 - 100.0 fL   MCH 27.0 26.0 - 34.0 pg   MCHC 31.8 30.0 - 36.0 g/dL   RDW 54.0 98.1 - 19.1 %   Platelets 234 150 - 400 K/uL   nRBC 0.0 0.0 - 0.2 %   Neutrophils Relative % 68 %   Neutro Abs 6.1 1.7 - 7.7 K/uL   Lymphocytes Relative 21 %   Lymphs Abs 1.9 0.7 - 4.0 K/uL   Monocytes Relative 10 %   Monocytes Absolute 0.9 0.1 - 1.0 K/uL   Eosinophils Relative 1 %   Eosinophils Absolute 0.1 0.0 - 0.5 K/uL   Basophils Relative 0 %   Basophils Absolute 0.0 0.0 - 0.1 K/uL   Immature Granulocytes 0 %   Abs Immature Granulocytes 0.03 0.00 - 0.07 K/uL    Comment: Performed at [facility], [address] [place], [place] [address]  C-reactive protein     Status: Abnormal   Collection Time: 12/03/23  8:25 AM  Result Value  Ref Range   CRP 1.3 (H) <1.0 mg/dL    Comment: Performed at [facility], [address] [place], [place] [address]  Sedimentation rate     Status: None   Collection Time: 12/03/23  8:25 AM  Result Value Ref Range   Sed Rate 5 0 - 16 mm/hr    Comment: Performed at [facility], [address], [place], [place] [address]   DG Hand 2 View Left  Result Date: 12/03/2023 CLINICAL DATA:  MC-DGconcern for FTS eft 4th finger is swelling and questionable spider bite, states he was incarcerated 11/29 , was found in a car had been using drugs and doesn't know if he injuried EXAM: LEFT HAND - 2 VIEW COMPARISON:  None Available. FINDINGS: There is soft tissue swelling of the fifth digit. No foreign body identified. No fracture or osseous erosion. IMPRESSION: Soft tissue swelling of the fifth digit. No foreign body or osseous abnormality. Electronically Signed   By: Genevive Bi M.D.   On: 12/03/2023 08:27    Pending Labs Unresulted Labs (From admission, onward)     Start     Ordered   12/04/23 0500  Basic metabolic panel  Tomorrow morning,   R        12/03/23 1243   12/04/23 0500  CBC  Tomorrow morning,   R        12/03/23 1243   12/03/23 1244  HIV Antibody (routine testing w rflx)  (HIV Antibody (Routine testing w reflex) panel)  Add-on,   AD        12/03/23 1243   12/03/23 1135  Aerobic/Anaerobic Culture w Gram Stain (surgical/deep wound)  Once,   URGENT       Question Answer Comment  Patient immune status Normal   Release to patient Immediate      12/03/23 1134            Vitals/Pain Today's Vitals   12/03/23 0811 12/03/23 1000  BP:  116/68  Pulse:  (!) 49  SpO2:  100%  Weight: 59 kg   Height: 5\' 8"  (1.727 m)   PainSc: 6      Isolation Precautions No active isolations  Medications Medications  bacitracin ointment ( Topical Given 12/03/23 1133)  enoxaparin (LOVENOX) injection 40 mg (has no administration in time range)  oxyCODONE (Oxy IR/ROXICODONE)  immediate release tablet 5 mg (has no administration in time range)  acetaminophen (TYLENOL) tablet 1,000 mg (1,000 mg Oral Given 12/03/23 1256)  ibuprofen (ADVIL) tablet 800 mg (800 mg Oral Given 12/03/23 1257)  vancomycin (VANCOCIN) IVPB 1000 mg/200 mL premix (has no administration in time range)  vancomycin (VANCOREADY) IVPB 1250 mg/250 mL (0 mg Intravenous Stopped 12/03/23 1132)  piperacillin-tazobactam (ZOSYN) IVPB 3.375 g (0 g Intravenous Stopped 12/03/23 0910)  morphine (PF) 4 MG/ML injection 4 mg (4 mg Intravenous Given 12/03/23 0830)  ondansetron (ZOFRAN) injection 4 mg (4 mg Intravenous Given 12/03/23 0830)  lidocaine-EPINEPHrine (XYLOCAINE W/EPI) 1 %-1:100000 (with pres) injection 10 mL (10 mLs Intradermal Given 12/03/23 1040)    Mobility walks     Focused Assessments    R Recommendations: See Admitting Provider Note  Report given to:   Additional Notes:  Patient is from the jail sheriff duty will be staying with him.Patient is very nice a/o.

## 2023-12-03 NOTE — Consult Note (Addendum)
Reason for Consult:Left little finger infection Referring Physician: Gerhard Munch Time called: 4332 Time at bedside: 0902   William Macdonald is an 22 y.o. male.  HPI: William Macdonald comes in with a 4d hx/o pain and swelling of the left little finger. He does not remember any wound. The pain and swelling have gradually gotten worse and he has now begun having pain into the palm of his hand. He's had night sweats but no chills, fever, or N/V. He is RHD and incarcerated.  History reviewed. No pertinent past medical history.  Past Surgical History:  Procedure Laterality Date   TONSILLECTOMY      History reviewed. No pertinent family history.  Social History:  reports that he has been smoking cigarettes. He has been exposed to tobacco smoke. He has never used smokeless tobacco. He reports current alcohol use. He reports current drug use. Drug: Cocaine.  Allergies:  Allergies  Allergen Reactions   Zyprexa [Olanzapine] Other (See Comments)    Muscle spams    Medications: I have reviewed the patient's current medications.  Results for orders placed or performed during the hospital encounter of 12/03/23 (from the past 48 hour(s))  Basic metabolic panel     Status: Abnormal   Collection Time: 12/03/23  8:25 AM  Result Value Ref Range   Sodium 136 135 - 145 mmol/L   Potassium 3.6 3.5 - 5.1 mmol/L   Chloride 101 98 - 111 mmol/L   CO2 27 22 - 32 mmol/L   Glucose, Bld 62 (L) 70 - 99 mg/dL    Comment: Glucose reference range applies only to samples taken after fasting for at least 8 hours.   BUN 7 6 - 20 mg/dL   Creatinine, Ser 9.51 0.61 - 1.24 mg/dL   Calcium 8.9 8.9 - 88.4 mg/dL   GFR, Estimated >16 >60 mL/min    Comment: (NOTE) Calculated using the CKD-EPI Creatinine Equation (2021)    Anion gap 8 5 - 15    Comment: Performed at [facility], [address] [place], [place] [address]  CBC with Differential     Status: None   Collection Time: 12/03/23  8:25 AM  Result Value  Ref Range   WBC 9.0 4.0 - 10.5 K/uL   RBC 5.11 4.22 - 5.81 MIL/uL   Hemoglobin 13.8 13.0 - 17.0 g/dL   HCT 01.0 93.2 - 35.5 %   MCV 84.9 80.0 - 100.0 fL   MCH 27.0 26.0 - 34.0 pg   MCHC 31.8 30.0 - 36.0 g/dL   RDW 73.2 20.2 - 54.2 %   Platelets 234 150 - 400 K/uL   nRBC 0.0 0.0 - 0.2 %   Neutrophils Relative % 68 %   Neutro Abs 6.1 1.7 - 7.7 K/uL   Lymphocytes Relative 21 %   Lymphs Abs 1.9 0.7 - 4.0 K/uL   Monocytes Relative 10 %   Monocytes Absolute 0.9 0.1 - 1.0 K/uL   Eosinophils Relative 1 %   Eosinophils Absolute 0.1 0.0 - 0.5 K/uL   Basophils Relative 0 %   Basophils Absolute 0.0 0.0 - 0.1 K/uL   Immature Granulocytes 0 %   Abs Immature Granulocytes 0.03 0.00 - 0.07 K/uL    Comment: Performed at [facility], [address], [place], [place] [address]    DG Hand 2 View Left  Result Date: 12/03/2023 CLINICAL DATA:  MC-DGconcern for FTS eft 4th finger is swelling and questionable spider bite, states he was incarcerated 11/29 ,  was found in a car had been using drugs and doesn't know if he injuried EXAM: LEFT HAND - 2 VIEW COMPARISON:  None Available. FINDINGS: There is soft tissue swelling of the fifth digit. No foreign body identified. No fracture or osseous erosion. IMPRESSION: Soft tissue swelling of the fifth digit. No foreign body or osseous abnormality. Electronically Signed   By: Genevive Bi M.D.   On: 12/03/2023 08:27    Review of Systems  Constitutional:  Positive for diaphoresis. Negative for chills and fever.  HENT:  Negative for ear discharge, ear pain, hearing loss and tinnitus.   Eyes:  Negative for photophobia and pain.  Respiratory:  Negative for cough and shortness of breath.   Cardiovascular:  Negative for chest pain.  Gastrointestinal:  Negative for abdominal pain, nausea and vomiting.  Genitourinary:  Negative for dysuria, flank pain, frequency and urgency.  Musculoskeletal:  Positive for arthralgias (Left little finger). Negative for back  pain, myalgias and neck pain.  Neurological:  Negative for dizziness and headaches.  Hematological:  Does not bruise/bleed easily.  Psychiatric/Behavioral:  The patient is not nervous/anxious.    Height 5\' 8"  (1.727 m), weight 59 kg. Physical Exam Constitutional:      General: He is not in acute distress.    Appearance: He is well-developed. He is not diaphoretic.  HENT:     Head: Normocephalic and atraumatic.  Eyes:     General: No scleral icterus.       Right eye: No discharge.        Left eye: No discharge.     Conjunctiva/sclera: Conjunctivae normal.  Cardiovascular:     Rate and Rhythm: Normal rate and regular rhythm.  Pulmonary:     Effort: Pulmonary effort is normal. No respiratory distress.  Musculoskeletal:     Cervical back: Normal range of motion.     Comments: Left shoulder, elbow, wrist, digits- Fusiform edema little finger, severe TTP flexor surface, severe pain with passive stretch, palm TTP prox to little finger, no instability, no blocks to motion  Sens  Ax/R/M/U intact  Mot   Ax/ R/ PIN/ M/ AIN/ U intact  Rad 2+  Skin:    General: Skin is warm and dry.  Neurological:     Mental Status: He is alert.  Psychiatric:        Mood and Affect: Mood normal.        Behavior: Behavior normal.     Assessment/Plan: Left little finger infection -- Plan bedside I&D by Dr. Janee Morn this am. Will need medical admission for IV abx.    Freeman Caldron, PA-C Orthopedic Surgery [phone] 12/03/2023, 9:21 AM

## 2023-12-03 NOTE — Progress Notes (Signed)
New Admission Note:   Arrival Method: stretcher Mental Orientation: aa+ox4 Telemetry: n/a Assessment: Completed Skin: c/d/i IV: right arm Pain: 6/10 left pinky finger Tubes: n/a Safety Measures: Safety Fall Prevention Plan has been given, discussed and signed Admission: Completed [facility] Orientation: Patient has been orientated to the room, unit and staff.  Family: not present  Orders have been reviewed and implemented. Will continue to monitor the patient. Call light has been placed within reach and bed alarm has been activated.   Margarita Grizzle, RN

## 2023-12-04 ENCOUNTER — Other Ambulatory Visit (HOSPITAL_COMMUNITY): Payer: Self-pay

## 2023-12-04 DIAGNOSIS — L02414 Cutaneous abscess of left upper limb: Secondary | ICD-10-CM

## 2023-12-04 LAB — BASIC METABOLIC PANEL
Anion gap: 9 (ref 5–15)
BUN: 16 mg/dL (ref 6–20)
CO2: 24 mmol/L (ref 22–32)
Calcium: 8.4 mg/dL — ABNORMAL LOW (ref 8.9–10.3)
Chloride: 104 mmol/L (ref 98–111)
Creatinine, Ser: 0.64 mg/dL (ref 0.61–1.24)
GFR, Estimated: >60 mL/min (ref >60)
Glucose, Bld: 86 mg/dL (ref 70–99)
Potassium: 3.9 mmol/L (ref 3.5–5.1)
Sodium: 137 mmol/L (ref 135–145)

## 2023-12-04 LAB — CBC
HCT: 42.7 % (ref 39.0–52.0)
Hemoglobin: 13.4 g/dL (ref 13.0–17.0)
MCH: 26.9 pg (ref 26.0–34.0)
MCHC: 31.4 g/dL (ref 30.0–36.0)
MCV: 85.6 fL (ref 80.0–100.0)
Platelets: 218 10*3/uL (ref 150–400)
RBC: 4.99 MIL/uL (ref 4.22–5.81)
RDW: 13 % (ref 11.5–15.5)
WBC: 6.1 10*3/uL (ref 4.0–10.5)
nRBC: 0 % (ref 0.0–0.2)

## 2023-12-04 LAB — MRSA NEXT GEN BY PCR, NASAL: MRSA by PCR Next Gen: NOT DETECTED

## 2023-12-04 MED ORDER — OXYCODONE HCL 5 MG PO TABS
5.0000 mg | ORAL_TABLET | Freq: Four times a day (QID) | ORAL | 0 refills | Status: AC | PRN
  Filled 2023-12-04: qty 8, 2d supply, fill #0

## 2023-12-04 MED ORDER — MELATONIN 3 MG PO TABS
3.0000 mg | ORAL_TABLET | Freq: Every day | ORAL | Status: DC
  Administered 2023-12-04: 3 mg via ORAL
  Filled 2023-12-04: qty 1

## 2023-12-04 MED ORDER — SENNA 8.6 MG PO TABS
1.0000 | ORAL_TABLET | Freq: Every day | ORAL | Status: DC
  Administered 2023-12-04 – 2023-12-05 (×2): 8.6 mg via ORAL
  Filled 2023-12-04 (×2): qty 1

## 2023-12-04 MED ORDER — ACETAMINOPHEN 500 MG PO TABS
1000.0000 mg | ORAL_TABLET | Freq: Four times a day (QID) | ORAL | 0 refills | Status: DC | PRN
  Filled 2023-12-04: qty 30, 4d supply, fill #0

## 2023-12-04 MED ORDER — BACITRACIN ZINC 500 UNIT/GM EX OINT
TOPICAL_OINTMENT | Freq: Three times a day (TID) | CUTANEOUS | 0 refills | Status: DC
  Filled 2023-12-04: qty 28.4, 10d supply, fill #0

## 2023-12-04 MED ORDER — IBUPROFEN 800 MG PO TABS
800.0000 mg | ORAL_TABLET | Freq: Three times a day (TID) | ORAL | 0 refills | Status: AC | PRN
  Filled 2023-12-04: qty 9, 3d supply, fill #0

## 2023-12-04 MED ORDER — SERTRALINE HCL 50 MG PO TABS
50.0000 mg | ORAL_TABLET | Freq: Every day | ORAL | 0 refills | Status: DC
  Filled 2023-12-04: qty 30, 30d supply, fill #0

## 2023-12-04 MED ORDER — DOXYCYCLINE HYCLATE 100 MG PO TABS
100.0000 mg | ORAL_TABLET | Freq: Two times a day (BID) | ORAL | 0 refills | Status: AC
Start: 2023-12-05 — End: 2023-12-10
  Filled 2023-12-04: qty 10, 5d supply, fill #0

## 2023-12-04 MED ORDER — RISPERIDONE 0.5 MG PO TABS
0.5000 mg | ORAL_TABLET | Freq: Every day | ORAL | 0 refills | Status: DC
  Filled 2023-12-04: qty 30, 30d supply, fill #0

## 2023-12-04 NOTE — Progress Notes (Signed)
Did well overnight Reports dressing changes/wound care has been initiated.  Eating lunch currently  AFeb Digit dressed, clean externally ROM decreased from normal No worsening of prox swelling/redness, etc.  Wbc 6.1  Recs: Continue wound care/ROM ex/anti-microbials (tailor to cultures as needed) Seems to have turned the corner and is making gains in right direction from this finger abscess Will sign-off, but remain available for input as might be needed  Neil Crouch, MD Hand Surgery

## 2023-12-04 NOTE — Progress Notes (Signed)
PT Cancellation Note  Patient Details Name: William Macdonald MRN: [id number] DOB: [birth year]   Cancelled Treatment:    Reason Eval/Treat Not Completed: (P) PT screened, no needs identified, will sign off.  Per OT pt ambulating independently inroom without PT needs. Should needs change, please reconsult. Thank you for this referral.  Jamesetta Geralds, PT, DPT [facility] Rehabilitation Department Office: [phone]   Jamesetta Geralds 12/04/2023, 12:32 PM

## 2023-12-04 NOTE — Evaluation (Signed)
Occupational Therapy Evaluation Patient Details Name: William Macdonald MRN: [id number] DOB: [birth year] Today's Date: 12/04/2023   History of Present Illness 22 yo male who presents with pain and gradual swelling in the Left 5th digit, potentially from a spider bite per his report. He is s/p I&D of affected digit. PMH of PTSD, personality disorder, depression   Clinical Impression   Pt overall very pleasant and receptive to therapy, he presents with some pain and loss of ROM in his L small finger. Educated pt on exercises to promote grip strength and ROM (see below), per guard he may not be able to have stress ball so had pt opt to use folded towels for grip activities. OT to continue to follow pt acutely to progress with ROM and strength of L hand as needed. Discussed with pt elevation to help reduce the swelling. Anticipate no post acute OT needed.        If plan is discharge home, recommend the following: Other (comment) (prn)    Functional Status Assessment  Patient has had a recent decline in their functional status and demonstrates the ability to make significant improvements in function in a reasonable and predictable amount of time.  Equipment Recommendations  None recommended by OT    Recommendations for Other Services       Precautions / Restrictions Precautions Precautions: None Restrictions Weight Bearing Restrictions: No      Mobility Bed Mobility Overal bed mobility: Independent                  Transfers Overall transfer level: Independent                        Balance Overall balance assessment: Mild deficits observed, not formally tested                                         ADL either performed or assessed with clinical judgement   ADL Overall ADL's : Modified independent                                       General ADL Comments: Pt overall independent/mod I for ADLs. Educated pt on ROM exercises  for his hands post I&D of L small finger.     Vision         Perception         Praxis         Pertinent Vitals/Pain Pain Assessment Pain Assessment: Faces Faces Pain Scale: Hurts little more Pain Location: L small finger with ROM Pain Descriptors / Indicators: Aching Pain Intervention(s): Limited activity within patient's tolerance, Monitored during session     Extremity/Trunk Assessment Upper Extremity Assessment Upper Extremity Assessment: Overall WFL for tasks assessed;Right hand dominant;LUE deficits/detail LUE Deficits / Details: Decreased ROM of L small finger and composite fist 1" away from palm. Some mild numbess along ulnar aspect of L small finger   Lower Extremity Assessment Lower Extremity Assessment: Overall WFL for tasks assessed   Cervical / Trunk Assessment Cervical / Trunk Assessment: Normal   Communication Communication Communication: No apparent difficulties Cueing Techniques: Verbal cues   Cognition Arousal: Alert Behavior During Therapy: WFL for tasks assessed/performed Overall Cognitive Status: Within Functional Limits for tasks assessed  General Comments  L small finger dressed, no drainage noted    Exercises General Exercises - Upper Extremity Digit Composite Flexion: AROM, Left, 5 reps Other Exercises Other Exercises: thumb to finger opposition Other Exercises: Tendon glides x10 reps Other Exercises: MP/PIP flexion x5 reps with static hold to tolerance Other Exercises: Full composite flexion x5 reps using towel   Shoulder Instructions      Home Living Family/patient expects to be discharged to:: [facility]                                        Prior Functioning/Environment Prior Level of Function : Independent/Modified Independent             Mobility Comments: ind ADLs Comments: ind        OT Problem List: Pain      OT  Treatment/Interventions: Therapeutic exercise;Self-care/ADL training;Therapeutic activities;Balance training;Patient/family education    OT Goals(Current goals can be found in the care plan section) Acute Rehab OT Goals Patient Stated Goal: To get better OT Goal Formulation: With patient Time For Goal Achievement: 12/18/23 Potential to Achieve Goals: Good ADL Goals Pt/caregiver will Perform Home Exercise Program: Increased ROM;Independently;With written HEP provided (L small finger) Additional ADL Goal #2: Pt will improve L composite fist to .5" from palm  OT Frequency: Min 1X/week    Co-evaluation              AM-PAC OT "6 Clicks" Daily Activity     Outcome Measure Help from another person eating meals?: None Help from another person taking care of personal grooming?: None Help from another person toileting, which includes using toliet, bedpan, or urinal?: None Help from another person bathing (including washing, rinsing, drying)?: None Help from another person to put on and taking off regular upper body clothing?: None Help from another person to put on and taking off regular lower body clothing?: None 6 Click Score: 24   End of Session Nurse Communication: Mobility status  Activity Tolerance: Patient tolerated treatment well Patient left: in bed;with call bell/phone within reach;with family/visitor present  OT Visit Diagnosis: Pain Pain - Right/Left: Left Pain - part of body:  (small finger)                Time: 1027-2536 OT Time Calculation (min): 11 min Charges:  OT General Charges $OT Visit: 1 Visit OT Evaluation $OT Eval Low Complexity: 1 Low  12/04/2023  AB, OTR/L  Acute Rehabilitation Services  Office: [phone]   Tristan Schroeder 12/04/2023, 12:42 PM

## 2023-12-04 NOTE — Plan of Care (Signed)

## 2023-12-04 NOTE — Progress Notes (Signed)
HD#1 Subjective:   Summary: William Macdonald is a 22 y.o. 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  Overnight Events: no acute events  Patient evaluated this morning with officer at bedside.  Patient denies any new concerns.  States pain is better with regimen including oxycodone.  Notes drainage from wound but overall feeling improved.  Denies any fever, chills, nausea, vomiting.  Objective:  Vital signs in last 24 hours: Vitals:   12/03/23 1000 12/03/23 1341 12/03/23 2016 12/04/23 0421  BP: 116/68 131/68 124/72 (!) 95/49  Pulse: (!) 49 77 65 69  Resp:  18 18 19   Temp:  97.9 F (36.6 C) 98 F (36.7 C) 97.8 F (36.6 C)  TempSrc:  Oral Oral Oral  SpO2: 100% 100% 100% 100%  Weight:      Height:       Supplemental O2: Room Air SpO2: 100 %   Physical Exam:  Constitutional: well-appearing male laying in bed comfortably, in no acute distress Cardiovascular: Regular rate Pulmonary/Chest: Normal work of breathing on room air Neurological: Awake and alert Extremities: Left hand: Neurovascular function intact, able to move left fingers and sensation intact Skin: New dressing over left fingers appears clean dry and intact Psych: Normal mood and affect  Filed Weights   12/03/23 0811  Weight: 59 kg    No intake or output data in the 24 hours ending 12/04/23 0613 Net IO Since Admission: No IO data has been entered for this period [12/04/23 0613]  Pertinent Labs:    Latest Ref Rng & Units 12/04/2023    3:25 AM 12/03/2023    8:25 AM 11/20/2023    8:16 PM  CBC  WBC 4.0 - 10.5 K/uL 6.1  9.0  6.4   Hemoglobin 13.0 - 17.0 g/dL 40.9  81.1  91.4   Hematocrit 39.0 - 52.0 % 42.7  43.4  40.8   Platelets 150 - 400 K/uL 218  234  270        Latest Ref Rng & Units 12/04/2023    3:25 AM 12/03/2023    8:25 AM 11/20/2023    8:16 PM  CMP   Glucose 70 - 99 mg/dL 86  62  782   BUN 6 - 20 mg/dL 16  7  11    Creatinine 0.61 - 1.24 mg/dL 9.56  2.13  0.86   Sodium 135 - 145 mmol/L 137  136  137   Potassium 3.5 - 5.1 mmol/L 3.9  3.6  3.8   Chloride 98 - 111 mmol/L 104  101  105   CO2 22 - 32 mmol/L 24  27  25    Calcium 8.9 - 10.3 mg/dL 8.4  8.9  8.4   Total Protein 6.5 - 8.1 g/dL   7.0   Total Bilirubin <1.2 mg/dL   0.3   Alkaline Phos 38 - 126 U/L   191   AST 15 - 41 U/L   25   ALT 0 - 44 U/L   22     Imaging: DG Hand 2 View Left  Result Date: 12/03/2023 CLINICAL DATA:  MC-DGconcern for FTS eft 4th finger is swelling and questionable spider bite, states he was incarcerated 11/29 , was found in a car had been using drugs and doesn't know if he injuried EXAM: LEFT HAND - 2 VIEW COMPARISON:  None Available. FINDINGS: There  is soft tissue swelling of the fifth digit. No foreign body identified. No fracture or osseous erosion. IMPRESSION: Soft tissue swelling of the fifth digit. No foreign body or osseous abnormality. Electronically Signed   By: Genevive Bi M.D.   On: 12/03/2023 08:27    Assessment/Plan:   Principal Problem:   Skin abscess Active Problems:   Depression   Patient Summary: William Macdonald is a 22 y.o. 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  #Left fifth finger abscess status post I&D I&D completed yesterday with orthopedics.  Pending culture results.  No fevers or leukocytosis.  Continue IV antibiotics, will tailor pending culture results. Pain is addressed/managed.  Patient can follow-up with orthopedics outpatient if needed per their last note. -Appreciate orthopedics assistance -Continue vancomycin (day 2) -Pain regimen: Tylenol 1000 mg 4 times daily, ibuprofen 800 mg 3 times daily, as needed oxycodone 5 mg every 4 hours PRN -Scheduled bowel  regimen -Follow-up cultures -Continue dressing change and wound care  #Mood disorder #PTSD #Social determinants of health Patient is currently in jail since November 29 until early January.  Psychiatry evaluated him yesterday given concerns of being off his psychiatric medications, they have signed off at this time.  Home Zoloft and Risperdal were restarted.  No acute changes/worsening. -Appreciate psychiatry assistance -Continue Zoloft 50 mg daily -Continue Risperdal 0.5 mg daily  -Start melatonin nightly   Diet: Normal IVF: None,None VTE: Enoxaparin Code: Full PT/OT recs: Pending,   Dispo: Anticipated discharge to  return to [facility]  in 1-2 days pending culture results.   Rana Snare, DO Internal Medicine Resident PGY-2 Pager: [phone] Please contact the on call pager after 5 pm and on weekends at [phone].

## 2023-12-05 DIAGNOSIS — L02414 Cutaneous abscess of left upper limb: Secondary | ICD-10-CM | POA: Diagnosis not present

## 2023-12-05 MED ORDER — DOXYCYCLINE HYCLATE 100 MG PO TABS
100.0000 mg | ORAL_TABLET | Freq: Two times a day (BID) | ORAL | Status: DC
  Administered 2023-12-05: 100 mg via ORAL
  Filled 2023-12-05: qty 1

## 2023-12-05 NOTE — Progress Notes (Signed)
Patient refused new IV, AM labs, education provided, MD notified.

## 2023-12-05 NOTE — Plan of Care (Signed)

## 2023-12-05 NOTE — Progress Notes (Signed)
DISCHARGE NOTE HOME CALIAN HORNG to be discharged  [facility]  per MD order. Discussed prescriptions and follow up appointments jail personnel . Prescriptions given to jail guard; medication list explained in detail.   Skin clean, dry and intact without evidence of skin break down, no evidence of skin tears noted. IV catheter discontinued intact. Site without signs and symptoms of complications. Dressing and pressure applied. Pt denies pain at the site currently. No complaints noted.  Patient free of lines, drains, and wounds.   An After Visit Summary (AVS) was printed and given to the prison guard Patient escorted via wheelchair, and discharged home via prison transportation  Margarita Grizzle, RN

## 2023-12-05 NOTE — Discharge Instructions (Addendum)
You were hospitalized for abscess on your left fifth finger that has been drained by the hand surgeon.  You have improved.  You will need to continue wound care and dressing change for the open wound on your finger.  You will need to complete the antibiotic called doxycycline taken 2 times a day for 4 days.  We have resumed your medications for mood.  Thank you for allowing [place] to be part of your care.   Please note these changes made to your medications:  *Please START taking:  -Doxycycline 100 mg 2 times a day for 4 days (given morning dose in hospital, start evening dose tonight 12/8) -Tylenol and ibuprofen if needed for pain relief -Sertraline 50 mg daily -Risperidone 0.5 mg daily  Wound Care:  -Three times a day with warm soapy water soaks for 20 min followed by re-dressing with dab of bacitracin ointment in the open area only and VERY light gauze dressing to keep ointment in place, but to allow for full finger flexion and extension.  Patient may want to shower right after the soak before the new dressing is applied. -Encourage range of motion exercises to prevent permanent stiffness -Wound will probably take 2 to 3 weeks to fully heal-in and establish full re-epithelialization.  Please make sure to keep the wound on the finger clean and dry with proper wound care and dressing change.  If needed, can follow-up with hand surgeon (Dr. Mack Hook) at his office.

## 2023-12-05 NOTE — TOC Transition Note (Addendum)
Transition of Care [facility]) - CM/SW Discharge Note   Patient Details  Name: William Macdonald MRN: [id number] Date of Birth: [birth year]  [facility]) CM/SW Contact:  Ronny Bacon, RN Phone Number: 12/05/2023, 11:22 AM   Clinical Narrative:  Patient is being discharged today. Attempt made to call [facility] Nurses' station, no response. Patient will be transported back to the jail through [facility].   1142: Second attempt made to contact nursing unit at [facility], no response. Will fax discharge summary over to the jail once it's completed.  1218: Discharge summary faxed to [facility] Nurses' station [phone]).   Final next level of care: [facility] Barriers to Discharge: No Barriers Identified   Patient Goals and CMS Choice      Discharge Placement                         Discharge Plan and Services Additional resources added to the After Visit Summary for                                       Social Determinants of Health (SDOH) Interventions SDOH Screenings   Food Insecurity: No Food Insecurity (12/03/2023)  Housing: Low Risk  (12/03/2023)  Transportation Needs: No Transportation Needs (12/03/2023)  Utilities: Not At Risk (12/03/2023)  Social Connections: Unknown (04/27/2022)   [facility] from [facility]  Tobacco Use: High Risk (12/03/2023)     Readmission Risk Interventions     No data to display

## 2023-12-05 NOTE — Discharge Summary (Signed)
Name: William Macdonald MRN: [id number] DOB: [birth year] 22 y.o. PCP: Pcp, No  Date of Admission: 12/03/2023  7:46 AM Date of Discharge: 12/05/23 Attending Physician: Dr.  Lafonda Mosses  Discharge Diagnosis: Principal Problem:   Skin abscess Active Problems:   Depression    Discharge Medications: Allergies as of 12/05/2023       Reactions   Zyprexa [olanzapine] Other (See Comments)   Muscle spams   Trileptal [oxcarbazepine]    Leg spasms and muscle tension        Medication List     STOP taking these medications    acyclovir 400 MG tablet Commonly known as: ZOVIRAX   cephALEXin 500 MG capsule Commonly known as: KEFLEX       TAKE these medications    Acetaminophen Extra Strength 500 MG Tabs Take 2 tablets (1,000 mg total) by mouth every 6 (six) hours as needed for mild pain (pain score 1-3) or moderate pain (pain score 4-6).   doxycycline 100 MG tablet Commonly known as: VIBRA-TABS Take 1 tablet (100 mg total) by mouth 2 (two) times daily for 5 days.   FT Antibiotic ointment Generic drug: bacitracin Apply topically 3 (three) times daily.   ibuprofen 800 MG tablet Commonly known as: ADVIL Take 1 tablet (800 mg total) by mouth every 8 (eight) hours as needed for up to 3 days for moderate pain (pain score 4-6) or mild pain (pain score 1-3). What changed:  when to take this reasons to take this   oxyCODONE 5 MG immediate release tablet Commonly known as: Oxy IR/ROXICODONE Take 1 tablet (5 mg total) by mouth every 6 (six) hours as needed for up to 2 days for severe pain (pain score 7-10) or breakthrough pain.   risperiDONE 0.5 MG tablet Commonly known as: RISPERDAL Take 1 tablet (0.5 mg total) by mouth at bedtime.   sertraline 50 MG tablet Commonly known as: ZOLOFT Take 1 tablet (50 mg total) by mouth daily.               Discharge Care Instructions  (From admission, onward)           Start     Ordered   12/05/23 0000  Discharge wound care:        Comments: -Three times a day with warm soapy water soaks for 20 min followed by re-dressing with dab of bacitracin ointment in the open area only and VERY light gauze dressing to keep ointment in place, but to allow for full finger flexion and extension.  Patient may want to shower right after the soak before the new dressing is applied. -Encourage range of motion exercises to prevent permanent stiffness -Wound will probably take 2 to 3 weeks to fully heal-in and establish full re-epithelialization.   12/05/23 1107            Disposition and follow-up:   Mr.William Macdonald was discharged from [facility] in Good condition.  At the hospital follow up visit please address:  1.  Follow-up:  a.  Left fifth finger abscess growing MRSA: Ensure proper wound care, assess for signs of infection, ensure completion of antibiotic course    b.  Mood disorder: Ensure taking home medications including sertraline and risperidone  2.  Labs / imaging needed at time of follow-up: None  3.  Pending labs/ test needing follow-up: none  4.  Medication Changes  -Doxycycline 100 mg twice daily X 4 days (EOT 12/12)  -Tylenol as needed, ibuprofen  as needed for pain (unable to send short course of oxycodone due to being controlled medication)  -Sertraline 50 mg daily  -Risperidone 0.5 mg daily  -Bacitracin ointment  Follow-up Appointments:  Follow-up Information     Mack Hook, MD Follow up.   Specialty: Orthopedic Surgery Why: As needed Contact information: [address] [place] [address] [phone]                 Hospital Course by problem list: William Macdonald is a 22 y.o. 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   #Left fifth finger abscess status post I&D Presented with 4-day history of left fifth  finger wound.  Reports concern of spider bite on that finger.  Noted increased pain and swelling and difficulty bending finger.  Was started on Keflex in jail without improvement.  Patient is afebrile and otherwise hemodynamically stable with unremarkable CBC and BMP.  Hand surgeon consulted and performed I&D with purulent drainage.  Culture grew MRSA with sensitivity to tetracycline, vancomycin and Bactrim.  Patient was started on vancomycin and transition to doxycycline to continue MRSA coverage.  Patient remains hemodynamically stable and no signs of infection over wound site and discharged with completion of doxycycline 100 mg BID for 4 additional days for total abx course of 7 days.  Dispensed appropriate pain medications.  Patient can follow-up with orthopedics if needed at their office.  Wound care instructions provided at discharge.   #Mood disorder #PTSD #Social determinants of health Patient is currently in jail since November 29 until early January.  Inpatient psychiatry evaluated him given concerns of being off his psychiatric medications, his home Zoloft and risperidone were restarted.  No acute signs of psychosis.  Discharged with home Zoloft 50 mg daily and risperidone 0.5 mg daily.    Discharge Subjective: Patient evaluated at bedside with officer in room.  Patient states no acute concerns.  Pain is well-controlled.  Denies any signs of infection or worsening drainage of wound.  Discharge Exam:   BP 124/69 (BP Location: Right Arm)   Pulse 72   Temp 97.8 F (36.6 C) (Oral)   Resp 18   Ht 5\' 8"  (1.727 m)   Wt 59 kg   SpO2 98%   BMI 19.77 kg/m  Constitutional: well-appearing male laying in bed comfortably, in no acute distress Cardiovascular: Regular rate and rhythm Pulmonary/Chest: Normal work of breathing on room air MSK: Left fifth digit: Undressed wound, no surrounding erythema, no increased warmth or pain, no signs of purulent drainage Neurological: Alert and oriented  X3 Psych: Normal mood and affect  Pertinent Labs, Studies, and Procedures:     Latest Ref Rng & Units 12/04/2023    3:25 AM 12/03/2023    8:25 AM 11/20/2023    8:16 PM  CBC  WBC 4.0 - 10.5 K/uL 6.1  9.0  6.4   Hemoglobin 13.0 - 17.0 g/dL 91.4  78.2  95.6   Hematocrit 39.0 - 52.0 % 42.7  43.4  40.8   Platelets 150 - 400 K/uL 218  234  270        Latest Ref Rng & Units 12/04/2023    3:25 AM 12/03/2023    8:25 AM 11/20/2023    8:16 PM  CMP  Glucose 70 - 99 mg/dL 86  62  213   BUN 6 - 20 mg/dL  16  7  11    Creatinine 0.61 - 1.24 mg/dL 1.61  0.96  0.45   Sodium 135 - 145 mmol/L 137  136  137   Potassium 3.5 - 5.1 mmol/L 3.9  3.6  3.8   Chloride 98 - 111 mmol/L 104  101  105   CO2 22 - 32 mmol/L 24  27  25    Calcium 8.9 - 10.3 mg/dL 8.4  8.9  8.4   Total Protein 6.5 - 8.1 g/dL   7.0   Total Bilirubin <1.2 mg/dL   0.3   Alkaline Phos 38 - 126 U/L   191   AST 15 - 41 U/L   25   ALT 0 - 44 U/L   22     DG Hand 2 View Left  Result Date: 12/03/2023 CLINICAL DATA:  MC-DGconcern for FTS eft 4th finger is swelling and questionable spider bite, states he was incarcerated 11/29 , was found in a car had been using drugs and doesn't know if he injuried EXAM: LEFT HAND - 2 VIEW COMPARISON:  None Available. FINDINGS: There is soft tissue swelling of the fifth digit. No foreign body identified. No fracture or osseous erosion. IMPRESSION: Soft tissue swelling of the fifth digit. No foreign body or osseous abnormality. Electronically Signed   By: Genevive Bi M.D.   On: 12/03/2023 08:27     Discharge Instructions: Discharge Instructions     Call MD for:  difficulty breathing, headache or visual disturbances   Complete by: As directed    Call MD for:  hives   Complete by: As directed    Call MD for:  persistant nausea and vomiting   Complete by: As directed    Call MD for:  redness, tenderness, or signs of infection (pain, swelling, redness, odor or green/yellow discharge around incision  site)   Complete by: As directed    Call MD for:  severe uncontrolled pain   Complete by: As directed    Call MD for:  temperature >100.4   Complete by: As directed    Diet - low sodium heart healthy   Complete by: As directed    Discharge instructions   Complete by: As directed    You were hospitalized for abscess on your left fifth finger that has been drained by the hand surgeon.  You have improved.  You will need to continue wound care and dressing change for the open wound on your finger.  You will need to complete the antibiotic called doxycycline taken 2 times a day for 4 days.  We have resumed your medications for mood.  Thank you for allowing [place] to be part of your care.   Please note these changes made to your medications:  *Please START taking:  -Doxycycline 100 mg 2 times a day for 4 days (given morning dose in hospital, start evening dose tonight 12/8) -Tylenol and ibuprofen if needed for pain relief -Sertraline 50 mg daily -Risperidone 0.5 mg daily  Wound Care:  -Three times a day with warm soapy water soaks for 20 min followed by re-dressing with dab of bacitracin ointment in the open area only and VERY light gauze dressing to keep ointment in place, but to allow for full finger flexion and extension.  Patient may want to shower right after the soak before the new dressing is applied. -Encourage range of motion exercises to prevent permanent stiffness -Wound will probably take 2 to 3 weeks to fully heal-in and establish full re-epithelialization.  Please make sure to keep the wound on the finger clean and dry with proper wound care and dressing change.  If needed, can follow-up with hand surgeon (Dr. Mack Hook) at his office.   Discharge wound care:   Complete by: As directed    -Three times a day with warm soapy water soaks for 20 min followed by re-dressing with dab of bacitracin ointment in the open area only and VERY light gauze dressing to keep ointment in place,  but to allow for full finger flexion and extension.  Patient may want to shower right after the soak before the new dressing is applied. -Encourage range of motion exercises to prevent permanent stiffness -Wound will probably take 2 to 3 weeks to fully heal-in and establish full re-epithelialization.   Increase activity slowly   Complete by: As directed        Signed: Rana Snare, DO 12/05/2023, 12:01 PM   Pager: [phone]

## 2023-12-05 NOTE — Hospital Course (Addendum)
William Macdonald is a 22 y.o. 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   #Left fifth finger abscess status post I&D Presented with 4-day history of left fifth finger wound.  Reports concern of spider bite on that finger.  Noted increased pain and swelling and difficulty bending finger.  Was started on Keflex in jail without improvement.  Patient is afebrile and otherwise hemodynamically stable with unremarkable CBC and BMP.  Hand surgeon consulted and performed I&D with purulent drainage.  Culture grew MRSA with sensitivity to tetracycline, vancomycin and Bactrim.  Patient was started on vancomycin and transition to doxycycline to continue MRSA coverage.  Patient remains hemodynamically stable and no signs of infection over wound site and discharged with completion of doxycycline 100 mg BID for 4 additional days for total abx course of 7 days.  Dispensed appropriate pain medications.  Patient can follow-up with orthopedics if needed at their office.  Wound care instructions provided at discharge.   #Mood disorder #PTSD #Social determinants of health Patient is currently in jail since November 29 until early January.  Inpatient psychiatry evaluated him given concerns of being off his psychiatric medications, his home Zoloft and risperidone were restarted.  No acute signs of psychosis.  Discharged with home Zoloft 50 mg daily and risperidone 0.5 mg daily.

## 2023-12-06 ENCOUNTER — Other Ambulatory Visit (HOSPITAL_COMMUNITY): Payer: Self-pay

## 2023-12-08 LAB — AEROBIC/ANAEROBIC CULTURE W GRAM STAIN (SURGICAL/DEEP WOUND): Special Requests: NORMAL

## 2023-12-28 ENCOUNTER — Other Ambulatory Visit: Payer: Self-pay

## 2023-12-28 ENCOUNTER — Emergency Department (HOSPITAL_COMMUNITY)
Admission: EM | Admit: 2023-12-28 | Discharge: 2023-12-29 | Disposition: A | Discharge status: Home / Self Care | Payer: Medicaid Other | Attending: Emergency Medicine | Admitting: Emergency Medicine

## 2023-12-28 ENCOUNTER — Emergency Department (HOSPITAL_COMMUNITY): Payer: Medicaid Other

## 2023-12-28 DIAGNOSIS — W44F3XA Food entering into or through a natural orifice, initial encounter: Secondary | ICD-10-CM | POA: Insufficient documentation

## 2023-12-28 DIAGNOSIS — T189XXA Foreign body of alimentary tract, part unspecified, initial encounter: Secondary | ICD-10-CM | POA: Insufficient documentation

## 2023-12-28 DIAGNOSIS — R1110 Vomiting, unspecified: Secondary | ICD-10-CM | POA: Insufficient documentation

## 2023-12-28 DIAGNOSIS — Z79899 Other long term (current) drug therapy: Secondary | ICD-10-CM | POA: Diagnosis not present

## 2023-12-28 LAB — CBC WITH DIFFERENTIAL/PLATELET
Abs Immature Granulocytes: 0 10*3/uL (ref 0.00–0.07)
Basophils Absolute: 0 10*3/uL (ref 0.0–0.1)
Basophils Relative: 0 %
Eosinophils Absolute: 0 10*3/uL (ref 0.0–0.5)
Eosinophils Relative: 1 %
HCT: 43.7 % (ref 39.0–52.0)
Hemoglobin: 14 g/dL (ref 13.0–17.0)
Immature Granulocytes: 0 %
Lymphocytes Relative: 45 %
Lymphs Abs: 1.8 10*3/uL (ref 0.7–4.0)
MCH: 26.9 pg (ref 26.0–34.0)
MCHC: 32 g/dL (ref 30.0–36.0)
MCV: 84 fL (ref 80.0–100.0)
Monocytes Absolute: 0.4 10*3/uL (ref 0.1–1.0)
Monocytes Relative: 11 %
Neutro Abs: 1.8 10*3/uL (ref 1.7–7.7)
Neutrophils Relative %: 43 %
Platelets: 219 10*3/uL (ref 150–400)
RBC: 5.2 MIL/uL (ref 4.22–5.81)
RDW: 13.5 % (ref 11.5–15.5)
WBC: 4 10*3/uL (ref 4.0–10.5)
nRBC: 0 % (ref 0.0–0.2)

## 2023-12-28 LAB — BASIC METABOLIC PANEL
Anion gap: 8 (ref 5–15)
BUN: 14 mg/dL (ref 6–20)
CO2: 27 mmol/L (ref 22–32)
Calcium: 9.4 mg/dL (ref 8.9–10.3)
Chloride: 102 mmol/L (ref 98–111)
Creatinine, Ser: 0.74 mg/dL (ref 0.61–1.24)
GFR, Estimated: >60 mL/min (ref >60)
Glucose, Bld: 85 mg/dL (ref 70–99)
Potassium: 4.2 mmol/L (ref 3.5–5.1)
Sodium: 137 mmol/L (ref 135–145)

## 2023-12-28 LAB — ETHANOL: Alcohol, Ethyl (B): <10 mg/dL (ref <10)

## 2023-12-28 NOTE — ED Triage Notes (Signed)
 Pt to ED via [employer] from jail, currently in custody c/o swallowed foreign body. Apparently pt broke a disposable razor blade, and swallowed the blade part, which consist of one blade. Reports this was a self harm attempt.  Pt calm and cooperative in triage. Reports takes behavior health medications in jail.  Denies A/V/H.

## 2023-12-28 NOTE — ED Notes (Signed)
Patient transported to [employer] administrator with patient

## 2023-12-28 NOTE — ED Provider Notes (Signed)
 [place] EMERGENCY DEPARTMENT AT [facility] Provider Note   CSN: [id number] Arrival date & time: 12/28/23  2149     History  Chief Complaint  Patient presents with   Swallowed Foreign Body    William Macdonald is a 22 y.o. male.  The history is provided by the patient and medical records.  Swallowed Foreign Body   41 22 year old male with history of depression,  presenting to the ED after swallowing a disposable razor blade.  He is currently incarcerated, swallowed this about 2 hours prior to arrival in the ED.  He did not tape blade.  He reports immediately after swallowing this he vomited up some blood which he caught in his hand, officer at bedside confirms this. He denies any vomiting since that time.  He is not on anticoagulation.  No hx of similar in the past.  When questioned about why he did this he states was having a bad day.  He denies active SI/HI.  Home Medications Prior to Admission medications   Medication Sig Start Date End Date Taking? Authorizing Provider  acetaminophen  (TYLENOL ) 500 MG tablet Take 2 tablets (1,000 mg total) by mouth every 6 (six) hours as needed for mild pain (pain score 1-3) or moderate pain (pain score 4-6). 12/04/23   Zheng, Michael, DO  bacitracin  ointment Apply topically 3 (three) times daily. 12/04/23   Zheng, Michael, DO  risperiDONE  (RISPERDAL ) 0.5 MG tablet Take 1 tablet (0.5 mg total) by mouth at bedtime. 12/04/23   Zheng, Michael, DO  sertraline  (ZOLOFT ) 50 MG tablet Take 1 tablet (50 mg total) by mouth daily. 12/04/23   Zheng, Michael, DO      Allergies    Zyprexa [olanzapine] and Trileptal [oxcarbazepine]    Review of Systems   Review of Systems  Gastrointestinal:        Swallowed FB  All other systems reviewed and are negative.   Physical Exam Updated Vital Signs BP 118/87 (BP Location: Left Arm)   Pulse 68   Temp 98.4 F (36.9 C) (Oral)   Resp 16   Ht 5' 4 (1.626 m)   Wt 68 kg   SpO2 100%   BMI 25.75  kg/m   Physical Exam Vitals and nursing note reviewed.  Constitutional:      Appearance: He is well-developed.  HENT:     Head: Normocephalic and atraumatic.     Mouth/Throat:     Comments: No blood noted in posterior oropharynx Eyes:     Conjunctiva/sclera: Conjunctivae normal.     Pupils: Pupils are equal, round, and reactive to light.  Cardiovascular:     Rate and Rhythm: Normal rate and regular rhythm.     Heart sounds: Normal heart sounds.  Pulmonary:     Effort: Pulmonary effort is normal. No respiratory distress.     Breath sounds: Normal breath sounds. No rhonchi.  Abdominal:     General: Bowel sounds are normal.     Palpations: Abdomen is soft.  Musculoskeletal:        General: Normal range of motion.     Cervical back: Normal range of motion.     Comments: Dried blood present right hand  Skin:    General: Skin is warm and dry.  Neurological:     Mental Status: He is alert and oriented to person, place, and time.     ED Results / Procedures / Treatments   Labs (all labs ordered are listed, but only abnormal results are displayed)  Labs Reviewed  CBC WITH DIFFERENTIAL/PLATELET  BASIC METABOLIC PANEL  ETHANOL  RAPID URINE DRUG SCREEN, HOSP PERFORMED    EKG None  Radiology DG Abdomen Acute W/Chest Result Date: 12/28/2023 CLINICAL DATA:  Swallowed razor blade EXAM: DG ABDOMEN ACUTE WITH 1 VIEW CHEST COMPARISON:  None Available. FINDINGS: Cardiac shadows within normal limits. Lungs are clear bilaterally. No infiltrate or effusion is seen. Scattered large and small bowel gas is noted. Fecal material is noted throughout the colon consistent with a mild degree of constipation. Radiopaque foreign body is noted in the midline in what appears to be the distal aspect of the stomach consistent with the given clinical history of swallowed razor blade. No other focal abnormality is noted. IMPRESSION: Ingested razor blade is noted within the distal stomach. Mild colonic  constipation. Electronically Signed   By: Oneil Devonshire M.D.   On: 12/28/2023 23:53    Procedures Procedures    Medications Ordered in ED Medications - No data to display  ED Course/ Medical Decision Making/ A&P                                 Medical Decision Making  22 year old male presenting to the ED after swallowing a razor blade.  States this occurred about 2 hours prior to arrival.  When questioned why he states was having a bad day.  He denies active SI/HI.  Apparently did vomit some blood earlier but none since that time.  Labs are grossly reassuring without leukocytosis or electrolyte derangement.  Hemoglobin is stable.  X-ray with single blade noted within the stomach.  No signs of perforation.  Will discuss with GI.  12:11 AM Spoke with on call GI, Dr. Rollin-- watchful waiting for now recommended.  More at risk to try and scope and retrieve at this point.  Monitor for bloody stools, etc.  Patient is currently incarcerated and he can be monitored closely.  I have discussed recommendations with officer at the bedside and also written in AVS.  Patient is now asking to eat.  Stable for discharge with above recommendations.  Can return here for new concerns.  Final Clinical Impression(s) / ED Diagnoses Final diagnoses:  Swallowed foreign body, initial encounter    Rx / DC Orders ED Discharge Orders     None         Jarold Olam HERO, PA-C 12/29/23 0022    Pamella Ozell LABOR, DO 12/29/23 [phone]

## 2023-12-28 NOTE — ED Provider Triage Note (Signed)
 Emergency Medicine Provider Triage Evaluation Note  KAVAUGHN FAUCETT , a 22 y.o. male  was evaluated in triage.  Pt complains of swallowed foreign body.  He swallowed a solitary shaving blade.  Currently incarcerated.  He did this to self-harm.  Review of Systems  Positive: Abdominal pain Negative: Numbness weakness vomiting  Physical Exam  BP 118/87 (BP Location: Left Arm)   Pulse 68   Temp 98.4 F (36.9 C) (Oral)   Resp 16   Ht 5' 4 (1.626 m)   Wt 68 kg   SpO2 100%   BMI 25.75 kg/m  Gen:   Awake, no distress   Resp:  Normal effort  MSK:   Moves extremities without difficulty Other:  Abdomen is nontender  Medical Decision Making  Medically screening exam initiated at 10:03 PM.  Appropriate orders placed.  ADLEY MAZUROWSKI was informed that the remainder of the evaluation will be completed by another provider, this initial triage assessment does not replace that evaluation, and the importance of remaining in the ED until their evaluation is complete.     Ruthe Cornet, DO 12/28/23 2204

## 2023-12-29 NOTE — Discharge Instructions (Signed)
 Razor blade in stomach. GI recommended watchful waiting until passage.  Monitor for blood in stool, etc. Return here for new concerns.

## 2023-12-31 ENCOUNTER — Emergency Department (HOSPITAL_COMMUNITY)

## 2023-12-31 ENCOUNTER — Encounter (HOSPITAL_COMMUNITY): Payer: Self-pay

## 2023-12-31 ENCOUNTER — Other Ambulatory Visit: Payer: Self-pay

## 2023-12-31 ENCOUNTER — Emergency Department (HOSPITAL_COMMUNITY)
Admission: EM | Admit: 2023-12-31 | Discharge: 2024-01-01 | Disposition: A | Discharge status: Home / Self Care | Attending: Emergency Medicine | Admitting: Emergency Medicine

## 2023-12-31 DIAGNOSIS — K920 Hematemesis: Secondary | ICD-10-CM | POA: Diagnosis not present

## 2023-12-31 DIAGNOSIS — W448XXA Other foreign body entering into or through a natural orifice, initial encounter: Secondary | ICD-10-CM | POA: Insufficient documentation

## 2023-12-31 DIAGNOSIS — T189XXD Foreign body of alimentary tract, part unspecified, subsequent encounter: Secondary | ICD-10-CM | POA: Insufficient documentation

## 2023-12-31 DIAGNOSIS — R441 Visual hallucinations: Secondary | ICD-10-CM | POA: Insufficient documentation

## 2023-12-31 DIAGNOSIS — F29 Unspecified psychosis not due to a substance or known physiological condition: Secondary | ICD-10-CM | POA: Diagnosis not present

## 2023-12-31 DIAGNOSIS — R42 Dizziness and giddiness: Secondary | ICD-10-CM | POA: Diagnosis not present

## 2023-12-31 DIAGNOSIS — R440 Auditory hallucinations: Secondary | ICD-10-CM | POA: Insufficient documentation

## 2023-12-31 DIAGNOSIS — F39 Unspecified mood [affective] disorder: Secondary | ICD-10-CM

## 2023-12-31 DIAGNOSIS — R443 Hallucinations, unspecified: Secondary | ICD-10-CM | POA: Diagnosis not present

## 2023-12-31 DIAGNOSIS — F17210 Nicotine dependence, cigarettes, uncomplicated: Secondary | ICD-10-CM | POA: Insufficient documentation

## 2023-12-31 DIAGNOSIS — Z8659 Personal history of other mental and behavioral disorders: Secondary | ICD-10-CM | POA: Diagnosis not present

## 2023-12-31 DIAGNOSIS — R45851 Suicidal ideations: Secondary | ICD-10-CM | POA: Insufficient documentation

## 2023-12-31 LAB — COMPREHENSIVE METABOLIC PANEL
ALT: 152 U/L — ABNORMAL HIGH (ref 0–44)
AST: 83 U/L — ABNORMAL HIGH (ref 15–41)
Albumin: 4.3 g/dL (ref 3.5–5.0)
Alkaline Phosphatase: 120 U/L (ref 38–126)
Anion gap: 9 (ref 5–15)
BUN: 14 mg/dL (ref 6–20)
CO2: 27 mmol/L (ref 22–32)
Calcium: 9.2 mg/dL (ref 8.9–10.3)
Chloride: 103 mmol/L (ref 98–111)
Creatinine, Ser: 0.45 mg/dL — ABNORMAL LOW (ref 0.61–1.24)
GFR, Estimated: >60 mL/min (ref >60)
Glucose, Bld: 77 mg/dL (ref 70–99)
Potassium: 3.9 mmol/L (ref 3.5–5.1)
Sodium: 139 mmol/L (ref 135–145)
Total Bilirubin: 0.5 mg/dL (ref 0.0–1.2)
Total Protein: 7.6 g/dL (ref 6.5–8.1)

## 2023-12-31 LAB — CBC WITH DIFFERENTIAL/PLATELET
Abs Immature Granulocytes: 0.01 10*3/uL (ref 0.00–0.07)
Basophils Absolute: 0 10*3/uL (ref 0.0–0.1)
Basophils Relative: 0 %
Eosinophils Absolute: 0 10*3/uL (ref 0.0–0.5)
Eosinophils Relative: 1 %
HCT: 44 % (ref 39.0–52.0)
Hemoglobin: 14.2 g/dL (ref 13.0–17.0)
Immature Granulocytes: 0 %
Lymphocytes Relative: 43 %
Lymphs Abs: 1.9 10*3/uL (ref 0.7–4.0)
MCH: 27.3 pg (ref 26.0–34.0)
MCHC: 32.3 g/dL (ref 30.0–36.0)
MCV: 84.6 fL (ref 80.0–100.0)
Monocytes Absolute: 0.4 10*3/uL (ref 0.1–1.0)
Monocytes Relative: 9 %
Neutro Abs: 2 10*3/uL (ref 1.7–7.7)
Neutrophils Relative %: 47 %
Platelets: 184 10*3/uL (ref 150–400)
RBC: 5.2 MIL/uL (ref 4.22–5.81)
RDW: 13.5 % (ref 11.5–15.5)
WBC: 4.4 10*3/uL (ref 4.0–10.5)
nRBC: 0 % (ref 0.0–0.2)

## 2023-12-31 LAB — RAPID URINE DRUG SCREEN, HOSP PERFORMED
Amphetamines: NOT DETECTED
Barbiturates: NOT DETECTED
Benzodiazepines: NOT DETECTED
Cocaine: NOT DETECTED
Opiates: NOT DETECTED
Tetrahydrocannabinol: NOT DETECTED

## 2023-12-31 LAB — LIPASE, BLOOD: Lipase: 29 U/L (ref 11–51)

## 2023-12-31 MED ORDER — RISPERIDONE 0.5 MG PO TABS
0.5000 mg | ORAL_TABLET | Freq: Every day | ORAL | Status: DC
  Administered 2024-01-01: 0.5 mg via ORAL
  Filled 2023-12-31: qty 1

## 2023-12-31 MED ORDER — SERTRALINE HCL 50 MG PO TABS
50.0000 mg | ORAL_TABLET | Freq: Every day | ORAL | Status: DC
  Administered 2023-12-31: 50 mg via ORAL
  Filled 2023-12-31: qty 1

## 2023-12-31 MED ORDER — ACETAMINOPHEN 500 MG PO TABS
1000.0000 mg | ORAL_TABLET | Freq: Four times a day (QID) | ORAL | Status: DC | PRN
  Administered 2023-12-31: 1000 mg via ORAL
  Filled 2023-12-31: qty 2

## 2023-12-31 NOTE — ED Provider Notes (Addendum)
 [place] EMERGENCY DEPARTMENT AT [facility] Provider Note   CSN: [id number] Arrival date & time: 12/31/23  1508     History  Chief Complaint  Patient presents with   Abdominal Pain   Hematemesis    William Macdonald is a 23 y.o. male.  Patient brought in by authorities from the jail.  Patient was seen on December 31 following the swallowing of a razor blade.  It was a compact 2 blade thing.  X-rays at that time showed that the razor blade was in the stomach.  They had consulted with GI medicine and then he was stable for discharge home.  Patient back today because he reports that he is vomiting dark red blood and having blood in his stool.  Also reports having nausea and dizziness.  He also informed me that his psychiatric medications need to be restarted or adjusted and that he is feeling suicidal.       Home Medications Prior to Admission medications   Medication Sig Start Date End Date Taking? Authorizing Provider  acetaminophen  (TYLENOL ) 500 MG tablet Take 2 tablets (1,000 mg total) by mouth every 6 (six) hours as needed for mild pain (pain score 1-3) or moderate pain (pain score 4-6). 12/04/23   Elicia Sharper, DO  bacitracin  ointment Apply topically 3 (three) times daily. 12/04/23   Zheng, Michael, DO  risperiDONE  (RISPERDAL ) 0.5 MG tablet Take 1 tablet (0.5 mg total) by mouth at bedtime. 12/04/23   Zheng, Michael, DO  sertraline  (ZOLOFT ) 50 MG tablet Take 1 tablet (50 mg total) by mouth daily. 12/04/23   Zheng, Michael, DO      Allergies    Zyprexa [olanzapine] and Trileptal [oxcarbazepine]    Review of Systems   Review of Systems  Constitutional:  Negative for chills and fever.  HENT:  Negative for ear pain and sore throat.   Eyes:  Negative for pain and visual disturbance.  Respiratory:  Negative for cough and shortness of breath.   Cardiovascular:  Negative for chest pain and palpitations.  Gastrointestinal:  Positive for abdominal pain, blood in stool  and vomiting.  Genitourinary:  Negative for dysuria and hematuria.  Musculoskeletal:  Negative for arthralgias and back pain.  Skin:  Negative for color change and rash.  Neurological:  Negative for seizures and syncope.  Psychiatric/Behavioral:  Positive for suicidal ideas.   All other systems reviewed and are negative.   Physical Exam Updated Vital Signs BP 106/76   Pulse 70   Temp 97.9 F (36.6 C)   Resp 18   Ht 1.626 m (5' 4)   Wt 67.1 kg   SpO2 97%   BMI 25.40 kg/m  Physical Exam Vitals and nursing note reviewed.  Constitutional:      General: He is not in acute distress.    Appearance: Normal appearance. He is well-developed.  HENT:     Head: Normocephalic and atraumatic.  Eyes:     Conjunctiva/sclera: Conjunctivae normal.  Cardiovascular:     Rate and Rhythm: Normal rate and regular rhythm.     Heart sounds: No murmur heard. Pulmonary:     Effort: Pulmonary effort is normal. No respiratory distress.     Breath sounds: Normal breath sounds.  Abdominal:     General: There is no distension.     Palpations: Abdomen is soft.     Tenderness: There is no abdominal tenderness. There is no guarding.  Musculoskeletal:        General: No swelling.  Cervical back: Neck supple.  Skin:    General: Skin is warm and dry.     Capillary Refill: Capillary refill takes less than 2 seconds.  Neurological:     General: No focal deficit present.     Mental Status: He is alert and oriented to person, place, and time.  Psychiatric:        Mood and Affect: Mood normal.     ED Results / Procedures / Treatments   Labs (all labs ordered are listed, but only abnormal results are displayed) Labs Reviewed  COMPREHENSIVE METABOLIC PANEL - Abnormal; Notable for the following components:      Result Value   Creatinine, Ser 0.45 (*)    AST 83 (*)    ALT 152 (*)    All other components within normal limits  CBC WITH DIFFERENTIAL/PLATELET  LIPASE, BLOOD  RAPID URINE DRUG  SCREEN, HOSP PERFORMED    EKG None  Radiology DG ABD ACUTE 2+V W 1V CHEST Result Date: 12/31/2023 CLINICAL DATA:  Follow-up swallowed razor blade EXAM: DG ABDOMEN ACUTE WITH 1 VIEW CHEST COMPARISON:  12/28/2023 FINDINGS: Cardiac shadow is within normal limits. Lungs are clear bilaterally. Abdomen shows scattered large and small bowel gas. Significant retained fecal material is noted consistent with colonic constipation. Persistent radiopaque foreign body is noted in the left mid abdomen appearing within the distal transverse colon migrated when compared with the prior examination. No new foreign body is seen. IMPRESSION: Changes consistent with ingested razor now within the distal transverse colon. Electronically Signed   By: Oneil Devonshire M.D.   On: 12/31/2023 18:06    Procedures Procedures    Medications Ordered in ED Medications - No data to display  ED Course/ Medical Decision Making/ A&P                                 Medical Decision Making Amount and/or Complexity of Data Reviewed Labs: ordered. Radiology: ordered.   Patient's acute abdominal series shows that the razor blade has moved to the distal transverse colon.  That is good progression.  No evidence of any complicating factors no evidence of any small bowel obstruction or free air.  Patient's lipase normal here 29 anion gap at 9 complete metabolic panel liver function test AST up a little bit at 83 ALT up a little bit at 152 but total bili is normal.  Renal function is normal electrolytes normal patient's CBC white count 4.4 hemoglobin is 14.2 unchanged from when he was seen on the 31st.  Based on his request to talk to psych about his psych meds and feeling suicidal.  Will put in psych consult.  Patient currently medically cleared and could be discharged back to the jail other than his psychiatric concerns.   Final Clinical Impression(s) / ED Diagnoses Final diagnoses:  Swallowed foreign body, subsequent encounter   Psychosis, unspecified psychosis type (HCC)  Suicidal ideation    Rx / DC Orders ED Discharge Orders     None         Geraldene Hamilton, MD 12/31/23 CELESTER Geraldene Hamilton, MD 12/31/23 CELESTER    Geraldene Hamilton, MD 12/31/23 [phone]

## 2023-12-31 NOTE — ED Triage Notes (Addendum)
 Pt BIBA from [place], currently in custody c/o left upper abd pain from previous encounter 12/28/2023 from swallowing a razor blade.  Pt reported to EMS that he vomited dark red blood and dark stool. Pt also reports he is having nausea and dizziness.  Pt mentioned that swallowing the blade was a self harm attempt but today he does not want to harm himself, he just wants to get better and find more mental health resources.

## 2024-01-01 ENCOUNTER — Encounter (HOSPITAL_COMMUNITY): Payer: Self-pay | Admitting: Psychiatric/Mental Health

## 2024-01-01 DIAGNOSIS — F39 Unspecified mood [affective] disorder: Secondary | ICD-10-CM

## 2024-01-01 DIAGNOSIS — Z8659 Personal history of other mental and behavioral disorders: Secondary | ICD-10-CM

## 2024-01-01 DIAGNOSIS — R443 Hallucinations, unspecified: Secondary | ICD-10-CM

## 2024-01-01 MED ORDER — SERTRALINE HCL 50 MG PO TABS: 50.0000 mg | ORAL_TABLET | Freq: Every day | ORAL | 0 refills | Status: DC

## 2024-01-01 MED ORDER — ARIPIPRAZOLE 10 MG PO TABS: 10.0000 mg | ORAL_TABLET | Freq: Every day | ORAL | 0 refills | Status: DC

## 2024-01-01 NOTE — Consult Note (Addendum)
 [facility] Consult Note  Patient Name: William Macdonald MRN: [id number] DOB: [birth year] DATE OF Consult: 01/01/2024  PRIMARY PSYCHIATRIC DIAGNOSES  1.  Mood Disorder, Unspecified  2.  Hallucinations- Psychosis, Unspecified   History of  Bipolar Disorder, Depression, ADHD, and ?Schizophrenia   RECOMMENDATIONS  Recommendations: Medication recommendations:  -- Continue Sertraline  50mg  po daily for depression/ anxiety -- Start Abilify  10mg  po at bedtime for mood stabilization, psychosis, anger   Non-Medication/therapeutic recommendations:  -- Patient safe to return to jail in police custody -- Patient should follow up with psychiatrist at jail upon return -- Patient should follow-up with outpatient psychiatrist and therapy once released from jail  Is inpatient psychiatric hospitalization recommended for this patient?  -- No, patient does not meet criteria for inpatient psychiatric admission at this time  From a psychiatric perspective, is this patient appropriate for discharge to an outpatient setting/resource or other less restrictive environment for continued care?  Patient is deemed appropriate to return to jail in police custody once medically cleared.   Communication: Treatment team members (and family members if applicable) who were involved in treatment/care discussions and planning, and with whom we spoke or engaged with via secure text/chat, include the following: ED primary team   Thank you for involving us  in the care of this patient. If you have any additional questions or concerns, please call [phone] and ask for me or the provider on-call.  TELEPSYCHIATRY ATTESTATION & CONSENT  As the provider for this telehealth consult, I attest that I verified the patient's identity using two separate identifiers, introduced myself to the patient, provided my credentials, disclosed my location, and performed this encounter via a HIPAA-compliant, real-time, face-to-face, two-way,  interactive audio and video platform and with the full consent and agreement of the patient (or guardian as applicable.)  Patient physical location: ED in [facility]. Telehealth provider physical location: home office in state of [place] .  Video start time: 0042 (Central Time) Video end time: 0100 (Central Time)  IDENTIFYING DATA  William Macdonald is a 23 y.o. year-old male for whom a psychiatric consultation has been ordered by the primary provider. The patient was identified using two separate identifiers.  CHIEF COMPLAINT/REASON FOR CONSULT  Depression   HISTORY OF PRESENT ILLNESS (HPI)  The patient is a 23yo male who presented to the emergency department in police custody from jail with concerns for hematemesis and bloody stools following the ingestion of a razor blade on 12/28/23. Patient also endorsed depression to the ED staff and requesting to speak to psychiatry regarding medication management.   The patient is calm and cooperative, alert and oriented x3. Patient states for the last 1 month he has been incarcerated awaiting his sentencing for breaking and entering, larceny, drug court/rehabilitation. Psychiatric history of Bipolar Disorder, Schizophrenia, Depression, ADHD, and substance abuse. Was previously homeless, unemployed. Patient states for the last 1 month he has struggled with his depression. States he feels horrible and has feelings of wanting to give up. Feels hopeless at times and feels there is no point in his life. Complains of frequent anger, rage, and racing thoughts. States he cusses the CO's out, breaks sprinklers. He is not sleeping well at the jail. Appetite intact. States he tried to talk to the psychiatrist at the jail but he treats me like an inmate. Patient endorses auditory hallucinations of people talking; denies command type. No visual hallucinations. Feels paranoid around the other inmates. States he is bothered by their conversations. No  suicidal or  homicidal ideations. No drug or alcohol use. Patient does not appear to be responding to internal stimuli, no thought blocking noted. No delusions apparent otherwise. No symptoms indicative of mania.   While incarcerated they have him on Risperdal  and Sertraline . Patient states he doesn't like the Risperdal  and he hasn't been taking the Sertraline . He asks if he can be prescribed Zyprexa and Remeron. Discussed possible issues with medications on formulary at jail. Also discussed how Zyprexa is listed as allergy. Patient states he had a reaction to a high dosage of Zyprexa- denies this is an allergy. Patient agreeable to continue to take Sertraline . Educated on medication indication, risks/benefits. Discussed need for mood stabilizer.     PAST PSYCHIATRIC HISTORY  Bipolar Disorder, Schizophrenia, Depression, ADHD, Substance Abuse Prior psychiatric hospitalizations  No outpatient services in place at this time   Otherwise as per HPI above.  PAST MEDICAL HISTORY  History reviewed. No pertinent past medical history.   HOME MEDICATIONS  Facility Ordered Medications  Medication   risperiDONE  (RISPERDAL ) tablet 0.5 mg   sertraline  (ZOLOFT ) tablet 50 mg   acetaminophen  (TYLENOL ) tablet 1,000 mg   PTA Medications  Medication Sig   acetaminophen  (TYLENOL ) 500 MG tablet Take 2 tablets (1,000 mg total) by mouth every 6 (six) hours as needed for mild pain (pain score 1-3) or moderate pain (pain score 4-6).   risperiDONE  (RISPERDAL ) 0.5 MG tablet Take 1 tablet (0.5 mg total) by mouth at bedtime.   bacitracin  ointment Apply topically 3 (three) times daily.   sertraline  (ZOLOFT ) 50 MG tablet Take 1 tablet (50 mg total) by mouth daily.     ALLERGIES  Allergies  Allergen Reactions   Zyprexa [Olanzapine] Other (See Comments)    Muscle spams   Trileptal [Oxcarbazepine]     Leg spasms and muscle tension    SOCIAL & SUBSTANCE USE HISTORY  Social History   Socioeconomic History   Marital  status: Single    Spouse name: Not on file   Number of children: Not on file   Years of education: Not on file   Highest education level: Not on file  Occupational History   Not on file  Tobacco Use   Smoking status: Every Day    Types: Cigarettes    Passive exposure: Yes   Smokeless tobacco: Never  Substance and Sexual Activity   Alcohol use: Yes    Comment: occ   Drug use: Yes    Types: Cocaine    Comment: Heroin   Sexual activity: Not on file  Other Topics Concern   Not on file  Social History Narrative   Not on file   Social Drivers of Health   Financial Resource Strain: Not on file  Food Insecurity: No Food Insecurity (12/03/2023)   Hunger Vital Sign    Worried About Running Out of Food in the Last Year: Never true    Ran Out of Food in the Last Year: Never true  Transportation Needs: No Transportation Needs (12/03/2023)   PRAPARE - Administrator, Civil Service (Medical): No    Lack of Transportation (Non-Medical): No  Physical Activity: Not on file  Stress: Not on file  Social Connections: Unknown (04/27/2022)   [facility] from [facility]   Social Network    Social Network: Not on file   Social History   Tobacco Use  Smoking Status Every Day   Types: Cigarettes   Passive exposure: Yes  Smokeless Tobacco Never   Social History  Substance and Sexual Activity  Alcohol Use Yes   Comment: occ   Social History   Substance and Sexual Activity  Drug Use Yes   Types: Cocaine   Comment: Heroin    Additional pertinent information: currently incarcerated. Homeless, unemployed    FAMILY HISTORY  History reviewed. No pertinent family history. Family Psychiatric History (if known):  None disclosed   MENTAL STATUS EXAM (MSE)  Mental Status Exam: General Appearance: Fairly Groomed  Orientation:  Full (Time, Place, and Person)  Memory:  Recent;   Fair  Concentration:  Concentration: Good  Recall:  Good  Attention  Good  Eye  Contact:  Good  Speech:  Clear and Coherent  Language:  Good  Volume:  Normal  Mood: Dysthymic   Affect:  Appropriate  Thought Process:  Coherent  Thought Content:  Logical, endorses hallucinations   Suicidal Thoughts:  No  Homicidal Thoughts:  No  Judgement:  Poor  Insight:  Lacking  Psychomotor Activity:  Normal  Akathisia:  No  Fund of Knowledge:  Fair    Assets:  Communication Skills Desire for Improvement Housing Physical Health  Cognition:  WNL  ADL's:  Intact  AIMS (if indicated):       VITALS  Blood pressure 115/78, pulse 64, temperature 97.9 F (36.6 C), temperature source Oral, resp. rate 16, height 5' 4 (1.626 m), weight 67.1 kg, SpO2 100%.  LABS  Admission on 12/31/2023  Component Date Value Ref Range Status   WBC 12/31/2023 4.4  4.0 - 10.5 K/uL Final   RBC 12/31/2023 5.20  4.22 - 5.81 MIL/uL Final   Hemoglobin 12/31/2023 14.2  13.0 - 17.0 g/dL Final   HCT 98/96/7974 44.0  39.0 - 52.0 % Final   MCV 12/31/2023 84.6  80.0 - 100.0 fL Final   MCH 12/31/2023 27.3  26.0 - 34.0 pg Final   MCHC 12/31/2023 32.3  30.0 - 36.0 g/dL Final   RDW 98/96/7974 13.5  11.5 - 15.5 % Final   Platelets 12/31/2023 184  150 - 400 K/uL Final   nRBC 12/31/2023 0.0  0.0 - 0.2 % Final   Neutrophils Relative % 12/31/2023 47  % Final   Neutro Abs 12/31/2023 2.0  1.7 - 7.7 K/uL Final   Lymphocytes Relative 12/31/2023 43  % Final   Lymphs Abs 12/31/2023 1.9  0.7 - 4.0 K/uL Final   Monocytes Relative 12/31/2023 9  % Final   Monocytes Absolute 12/31/2023 0.4  0.1 - 1.0 K/uL Final   Eosinophils Relative 12/31/2023 1  % Final   Eosinophils Absolute 12/31/2023 0.0  0.0 - 0.5 K/uL Final   Basophils Relative 12/31/2023 0  % Final   Basophils Absolute 12/31/2023 0.0  0.0 - 0.1 K/uL Final   Immature Granulocytes 12/31/2023 0  % Final   Abs Immature Granulocytes 12/31/2023 0.01  0.00 - 0.07 K/uL Final   Performed at [facility], [address] [place], [place] [address]    Sodium 12/31/2023 139  135 - 145 mmol/L Final   Potassium 12/31/2023 3.9  3.5 - 5.1 mmol/L Final   Chloride 12/31/2023 103  98 - 111 mmol/L Final   CO2 12/31/2023 27  22 - 32 mmol/L Final   Glucose, Bld 12/31/2023 77  70 - 99 mg/dL Final   Glucose reference range applies only to samples taken after fasting for at least 8 hours.   BUN 12/31/2023 14  6 - 20 mg/dL Final   Creatinine, Ser 12/31/2023 0.45 (L)  0.61 -  1.24 mg/dL Final   Calcium 98/96/7974 9.2  8.9 - 10.3 mg/dL Final   Total Protein 98/96/7974 7.6  6.5 - 8.1 g/dL Final   Albumin 98/96/7974 4.3  3.5 - 5.0 g/dL Final   AST 98/96/7974 83 (H)  15 - 41 U/L Final   ALT 12/31/2023 152 (H)  0 - 44 U/L Final   Alkaline Phosphatase 12/31/2023 120  38 - 126 U/L Final   Total Bilirubin 12/31/2023 0.5  0.0 - 1.2 mg/dL Final   GFR, Estimated 12/31/2023 >60  >60 mL/min Final   Comment: (NOTE) Calculated using the CKD-EPI Creatinine Equation (2021)    Anion gap 12/31/2023 9  5 - 15 Final   Performed at [facility], [address] [place], [place] [address]   Lipase 12/31/2023 29  11 - 51 U/L Final   Performed at [facility], [address], [place], [place] [address]   Opiates 12/31/2023 NONE DETECTED  NONE DETECTED Final   Cocaine 12/31/2023 NONE DETECTED  NONE DETECTED Final   Benzodiazepines 12/31/2023 NONE DETECTED  NONE DETECTED Final   Amphetamines 12/31/2023 NONE DETECTED  NONE DETECTED Final   Tetrahydrocannabinol 12/31/2023 NONE DETECTED  NONE DETECTED Final   Barbiturates 12/31/2023 NONE DETECTED  NONE DETECTED Final   Comment: (NOTE) DRUG SCREEN FOR MEDICAL PURPOSES ONLY.  IF CONFIRMATION IS NEEDED FOR ANY PURPOSE, NOTIFY LAB WITHIN 5 DAYS.  LOWEST DETECTABLE LIMITS FOR URINE DRUG SCREEN Drug Class                     Cutoff (ng/mL) Amphetamine and metabolites    1000 Barbiturate and metabolites    200 Benzodiazepine                 200 Opiates and metabolites        300 Cocaine  and metabolites        300 THC                            50 Performed at [facility], [address], [place], [place] [address]     PSYCHIATRIC REVIEW OF SYSTEMS (ROS)  ROS: Notable for the following relevant positive findings: Review of Systems  Psychiatric/Behavioral:  Positive for depression and hallucinations. Negative for suicidal ideas. The patient has insomnia.     Additional findings:      Musculoskeletal: No abnormal movements observed      Gait & Station: Laying/Sitting      Pain Screening: None disclosed at this time      Nutrition & Dental Concerns: None disclosed at this time  RISK FORMULATION/ASSESSMENT  Is the patient experiencing any suicidal or homicidal ideations: No       Explain if yes: passive death wishes; no suicidal ideations  Protective factors considered for safety management: access to care, willingness to seek treatment, future oriented   Risk factors/concerns considered for safety management: hallucinations Prior attempt Depression Substance abuse/dependence Impulsivity Aggression Male gender Unmarried  Is there a safety management plan with the patient and treatment team to minimize risk factors and promote protective factors: Yes           Explain: Patient currently in the ED. Will be returning to jail with medication adjustments.  Is crisis care placement or psychiatric hospitalization recommended: No     Based on my current evaluation and risk assessment, patient is determined at this time to be at:  Moderate Risk  *RISK ASSESSMENT Risk assessment is a dynamic process; it is possible that this patient's condition, and risk level, may change. This should be re-evaluated and managed over time as appropriate. Please re-consult psychiatric consult services if additional assistance is needed in terms of risk assessment and management. If your team decides to discharge this patient, please advise the patient how to best access  emergency psychiatric services, or to call 911, if their condition worsens or they feel unsafe in any way.   Pascha Fogal E Merril Nagy, NP Telepsychiatry Consult Services

## 2024-01-01 NOTE — ED Provider Notes (Signed)
 Emergency Medicine Observation Re-evaluation Note  William Macdonald is a 23 y.o. male, seen on rounds today.  Pt initially presented to the ED for complaints of Abdominal Pain and Hematemesis Currently, the patient is awake and alert.  Pt swallowed a razor blade on 12/31 and was d/c back to jail.  Pt came in last night due to vomiting and feeling suicidal.  Psych did clear him for d/c.  He is medically cleared from razor blade standpoint.  Physical Exam  BP 100/72   Pulse (!) 50   Temp 97.8 F (36.6 C)   Resp 14   Ht 5' 4 (1.626 m)   Wt 67.1 kg   SpO2 100%   BMI 25.40 kg/m  Physical Exam General: awake and alert Cardiac: rr Lungs: clear Psych: calm  ED Course / MDM  EKG:   I have reviewed the labs performed to date as well as medications administered while in observation.  Recent changes in the last 24 hours include psych clearance.  Plan  Current plan is for d/c back to jail where he has close observation.  Med recs: Continue Sertraline  50mg  po daily for depression/ anxiety -- Start Abilify  10mg  po at bedtime for mood stabilization, psychosis, anger     Dean Clarity, MD 01/01/24 [phone]

## 2024-01-06 ENCOUNTER — Emergency Department (HOSPITAL_COMMUNITY)
Admission: EM | Admit: 2024-01-06 | Discharge: 2024-01-06 | Attending: Emergency Medicine | Admitting: Emergency Medicine

## 2024-01-06 ENCOUNTER — Emergency Department (EMERGENCY_DEPARTMENT_HOSPITAL): Admitting: Anesthesiology

## 2024-01-06 ENCOUNTER — Emergency Department (HOSPITAL_COMMUNITY)

## 2024-01-06 ENCOUNTER — Emergency Department (HOSPITAL_COMMUNITY): Admitting: Anesthesiology

## 2024-01-06 ENCOUNTER — Encounter (HOSPITAL_COMMUNITY): Admission: EM | Payer: Self-pay | Source: Home / Self Care | Attending: Emergency Medicine

## 2024-01-06 ENCOUNTER — Encounter (HOSPITAL_COMMUNITY): Payer: Self-pay

## 2024-01-06 ENCOUNTER — Other Ambulatory Visit: Payer: Self-pay

## 2024-01-06 DIAGNOSIS — K2980 Duodenitis without bleeding: Secondary | ICD-10-CM | POA: Insufficient documentation

## 2024-01-06 DIAGNOSIS — W44A9XA Other batteries entering into or through a natural orifice, initial encounter: Secondary | ICD-10-CM | POA: Insufficient documentation

## 2024-01-06 DIAGNOSIS — K449 Diaphragmatic hernia without obstruction or gangrene: Secondary | ICD-10-CM | POA: Insufficient documentation

## 2024-01-06 DIAGNOSIS — K209 Esophagitis, unspecified without bleeding: Secondary | ICD-10-CM | POA: Diagnosis not present

## 2024-01-06 DIAGNOSIS — K2289 Other specified disease of esophagus: Secondary | ICD-10-CM

## 2024-01-06 DIAGNOSIS — T189XXA Foreign body of alimentary tract, part unspecified, initial encounter: Secondary | ICD-10-CM

## 2024-01-06 DIAGNOSIS — K2970 Gastritis, unspecified, without bleeding: Secondary | ICD-10-CM | POA: Insufficient documentation

## 2024-01-06 DIAGNOSIS — F319 Bipolar disorder, unspecified: Secondary | ICD-10-CM | POA: Insufficient documentation

## 2024-01-06 DIAGNOSIS — T182XXA Foreign body in stomach, initial encounter: Secondary | ICD-10-CM | POA: Insufficient documentation

## 2024-01-06 DIAGNOSIS — F17210 Nicotine dependence, cigarettes, uncomplicated: Secondary | ICD-10-CM | POA: Diagnosis not present

## 2024-01-06 DIAGNOSIS — K2990 Gastroduodenitis, unspecified, without bleeding: Secondary | ICD-10-CM

## 2024-01-06 HISTORY — PX: ESOPHAGOGASTRODUODENOSCOPY: SHX5428

## 2024-01-06 HISTORY — PX: FOREIGN BODY REMOVAL: SHX962

## 2024-01-06 LAB — CBC WITH DIFFERENTIAL/PLATELET
Abs Immature Granulocytes: 0.01 10*3/uL (ref 0.00–0.07)
Basophils Absolute: 0 10*3/uL (ref 0.0–0.1)
Basophils Relative: 0 %
Eosinophils Absolute: 0 10*3/uL (ref 0.0–0.5)
Eosinophils Relative: 0 %
HCT: 44.9 % (ref 39.0–52.0)
Hemoglobin: 14.6 g/dL (ref 13.0–17.0)
Immature Granulocytes: 0 %
Lymphocytes Relative: 34 %
Lymphs Abs: 1.7 10*3/uL (ref 0.7–4.0)
MCH: 27.2 pg (ref 26.0–34.0)
MCHC: 32.5 g/dL (ref 30.0–36.0)
MCV: 83.6 fL (ref 80.0–100.0)
Monocytes Absolute: 0.5 10*3/uL (ref 0.1–1.0)
Monocytes Relative: 9 %
Neutro Abs: 2.9 10*3/uL (ref 1.7–7.7)
Neutrophils Relative %: 57 %
Platelets: 190 10*3/uL (ref 150–400)
RBC: 5.37 MIL/uL (ref 4.22–5.81)
RDW: 13.7 % (ref 11.5–15.5)
WBC: 5.2 10*3/uL (ref 4.0–10.5)
nRBC: 0 % (ref 0.0–0.2)

## 2024-01-06 LAB — BASIC METABOLIC PANEL
Anion gap: 12 (ref 5–15)
BUN: 7 mg/dL (ref 6–20)
CO2: 24 mmol/L (ref 22–32)
Calcium: 9.4 mg/dL (ref 8.9–10.3)
Chloride: 102 mmol/L (ref 98–111)
Creatinine, Ser: 0.68 mg/dL (ref 0.61–1.24)
GFR, Estimated: >60 mL/min (ref >60)
Glucose, Bld: 94 mg/dL (ref 70–99)
Potassium: 3.9 mmol/L (ref 3.5–5.1)
Sodium: 138 mmol/L (ref 135–145)

## 2024-01-06 SURGERY — EGD (ESOPHAGOGASTRODUODENOSCOPY)
Anesthesia: General

## 2024-01-06 MED ORDER — LIDOCAINE 2% (20 MG/ML) 5 ML SYRINGE
INTRAMUSCULAR | Status: DC | PRN
  Administered 2024-01-06: 40 mg via INTRAVENOUS

## 2024-01-06 MED ORDER — DROPERIDOL 2.5 MG/ML IJ SOLN: 0.6250 mg | Freq: Once | INTRAMUSCULAR | Status: DC | PRN

## 2024-01-06 MED ORDER — PANTOPRAZOLE SODIUM 40 MG PO TBEC: 40.0000 mg | DELAYED_RELEASE_TABLET | Freq: Every day | ORAL | 0 refills | Status: DC

## 2024-01-06 MED ORDER — PROPOFOL 10 MG/ML IV BOLUS
INTRAVENOUS | Status: DC | PRN
  Administered 2024-01-06: 200 mg via INTRAVENOUS
  Administered 2024-01-06: 50 mg via INTRAVENOUS

## 2024-01-06 MED ORDER — FENTANYL CITRATE (PF) 100 MCG/2ML IJ SOLN: 25.0000 ug | INTRAMUSCULAR | Status: DC | PRN

## 2024-01-06 MED ORDER — PANTOPRAZOLE SODIUM 40 MG IV SOLR
40.0000 mg | Freq: Once | INTRAVENOUS | Status: AC
  Administered 2024-01-06: 40 mg via INTRAVENOUS
  Filled 2024-01-06: qty 10

## 2024-01-06 MED ORDER — ONDANSETRON HCL 4 MG/2ML IJ SOLN
INTRAMUSCULAR | Status: DC | PRN
  Administered 2024-01-06: 4 mg via INTRAVENOUS

## 2024-01-06 MED ORDER — SUCCINYLCHOLINE CHLORIDE 200 MG/10ML IV SOSY
PREFILLED_SYRINGE | INTRAVENOUS | Status: DC | PRN
  Administered 2024-01-06: 160 mg via INTRAVENOUS

## 2024-01-06 MED ORDER — ONDANSETRON HCL 4 MG/2ML IJ SOLN: 4.0000 mg | Freq: Once | INTRAMUSCULAR | Status: DC | PRN

## 2024-01-06 MED ORDER — SODIUM CHLORIDE 0.9 % IV SOLN: INTRAVENOUS | Status: DC

## 2024-01-06 NOTE — ED Notes (Signed)
 Discharge instructions reviewed with patient and officer at bedside.   Newly prescribed medications discussed and pharmacy verified.   Alert, oriented and ambulatory. Displays no signs of distress.   Opportunity for questions and concerns provided.   Escorted to officer vehicle in police custody via wheelchair.

## 2024-01-06 NOTE — ED Triage Notes (Addendum)
 Pt bib [employer] from jail after patient reports he swallowed 6-8 AAA batteries about two hours ago. He reports that he chewed on some of the batteries before he swallowed them. Pt reports that this was intentional, stating cause I didn't feel so well in jail. Pt denies chest pain, sob but endorses LUQ abdominal pain. Pt alert and oriented, GCS 15. Pt calm and cooperative.

## 2024-01-06 NOTE — ED Provider Notes (Signed)
 Emergency Department Provider Note   I have reviewed the triage vital signs and the nursing notes.   HISTORY  Chief Complaint Swallowed Foreign Body   HPI William Macdonald is a 23 y.o. male arrives from the [facility] jail in police custody after he reportedly swallowed 6-8 AAA batteries.  He did this approximately 2 hours prior to arrival.  He has been feeling some epigastric abdominal discomfort and nausea.  No vomiting.  Specifically no hematemesis.  No shortness of breath or chest pains.  Denies swallowing any button batteries. Denies doing this in the past. Denies intention self harm behavior. He tells me he was just upset about being in [place].    History reviewed. No pertinent past medical history.  Review of Systems  Constitutional: No fever/chills Cardiovascular: Denies chest pain. Respiratory: Denies shortness of breath. Gastrointestinal: Positive epigastric abdominal pain. Positive nausea, no vomiting.   Musculoskeletal: Negative for back pain. Skin: Negative for rash. Neurological: Negative for headaches.   ____________________________________________   PHYSICAL EXAM:  VITAL SIGNS: ED Triage Vitals  Encounter Vitals Group     BP 01/06/24 1659 (!) 135/111     Pulse Rate 01/06/24 1659 63     Resp 01/06/24 1659 15     Temp 01/06/24 1659 98.3 F (36.8 C)     Temp Source 01/06/24 1659 Oral     SpO2 01/06/24 1659 100 %   Constitutional: Alert and oriented. Well appearing and in no acute distress. Eyes: Conjunctivae are normal.  Head: Atraumatic. Nose: No congestion/rhinnorhea. Mouth/Throat: Mucous membranes are moist.   Neck: No stridor.   Cardiovascular: Good peripheral circulation.  Respiratory: Normal respiratory effort.   Gastrointestinal: Soft and nontender. No peritonitis. No distention.  Musculoskeletal: No gross deformities of extremities. Neurologic:  Normal speech and language. Skin:  Skin is warm, dry and intact. No rash  noted.  ____________________________________________   LABS (all labs ordered are listed, but only abnormal results are displayed)  Labs Reviewed  BASIC METABOLIC PANEL  CBC WITH DIFFERENTIAL/PLATELET   ____________________________________________  RADIOLOGY  DG Abd Portable 1V Result Date: 01/06/2024 CLINICAL DATA:  Surgery follow-up, swallowed foreign body EXAM: PORTABLE ABDOMEN - 1 VIEW COMPARISON:  Same day radiographs at 5:31 p.m. FINDINGS: Three cylindrical foreign bodies compatible with ingested batteries project over the mid and left hemiabdomen possibly within the small bowel. Large colonic stool burden. No evidence of obstruction. IMPRESSION: 3 ingested batteries are favored within the small bowel. Large colonic stool burden.  No evidence of obstruction. Electronically Signed   By: Norman Gatlin M.D.   On: 01/06/2024 21:22   DG Abdomen Acute W/Chest Result Date: 01/06/2024 CLINICAL DATA:  Ingested AAA batteries EXAM: DG ABDOMEN ACUTE WITH 1 VIEW CHEST COMPARISON:  12/31/2023 FINDINGS: The previously seen ingested razor blade as passed in the interval. Considerable retained fecal material is noted. Several metallic foreign bodies are noted in the left upper abdomen consistent with ingested batteries. There appear to be 6 although counting is somewhat difficult. No free air is seen. No bony abnormality is noted. Cardiac shadow is within normal limits. Lungs are clear bilaterally. IMPRESSION: Multiple ingested AAA batteries within the stomach. Electronically Signed   By: Oneil Devonshire M.D.   On: 01/06/2024 17:45    ____________________________________________   PROCEDURES  Procedure(s) performed:   Procedures   ____________________________________________   INITIAL IMPRESSION / ASSESSMENT AND PLAN / ED COURSE  Pertinent labs & imaging results that were available during my care of the patient were reviewed by  me and considered in my medical decision making (see chart for  details).   This patient is Presenting for Evaluation of abdominal pain/ingestion, which does require a range of treatment options, and is a complaint that involves a high risk of morbidity and mortality.  The Differential Diagnoses include battery ingestion, esophagitis, gastritis, esophageal/gastric perforation, etc.  Critical Interventions-    Medications  pantoprazole  (PROTONIX ) injection 40 mg (40 mg Intravenous Given 01/06/24 1833)    Reassessment after intervention:  patient feeling well on re-evaluation.    I did obtain Additional Historical Information from officer at bedside.   Clinical Laboratory Tests Ordered, included BMP without AKI or electrolytes. No anemia.   Radiologic Tests Ordered, included acute abd with chest. I independently interpreted the images and agree with radiology interpretation.   Cardiac Monitor Tracing which shows NSR.    Social Determinants of Health Risk patient smokes cigarettes and is currently incarcerated.   Consult complete with [facility] on call, Dr. Wilhelmenia. Plan for endoscopy.   Medical Decision Making: Summary:  Presents to the emergency department for evaluation of epigastric pain after ingesting multiple AAA batteries.  Denies button battery ingestion.  Plan for acute abdomen x-ray series, basic blood work, reassess after imaging.   Reevaluation with update and discussion with patient. He has returned from endoscopy with multiple batteries removed. Two appear to have passed into the small bowel. No pain on reassessment. Patient is tolerating PO. Will start PPI and strongly encouraged patient to stop this behavior and discussed risk of bowel perforation. Will allow for natural passage of remaining batteries per GI recommendation.   Patient's presentation is most consistent with acute presentation with potential threat to life or bodily function.   Disposition: discharge  ____________________________________________  FINAL CLINICAL  IMPRESSION(S) / ED DIAGNOSES  Final diagnoses:  Swallowed foreign body, initial encounter     NEW OUTPATIENT MEDICATIONS STARTED DURING THIS VISIT:  Discharge Medication List as of 01/06/2024 10:39 PM     START taking these medications   Details  pantoprazole  (PROTONIX ) 40 MG tablet Take 1 tablet (40 mg total) by mouth daily., Starting Thu 01/06/2024, Until Sat 02/05/2024, Normal        Note:  This document was prepared using Dragon voice recognition software and may include unintentional dictation errors.  Fonda Law, MD, [facility]    Ruairi Stutsman, Fonda [name], MD 01/07/24 [phone]

## 2024-01-06 NOTE — ED Notes (Signed)
 Contacted poison control, PC recommending EKG, acetaminophen level, and GI consult at this time.

## 2024-01-06 NOTE — Op Note (Signed)
 [facility] Patient Name: William Macdonald Procedure Date : 01/06/2024 MRN: [id number] Attending MD: Aloha Finner , MD, [id number] Date of Birth: August 12, 2001 CSN: [id number] Age: 23 Admit Type: Outpatient Procedure:                Upper GI endoscopy Indications:              Epigastric abdominal pain, Foreign body in the                            stomach Providers:                Aloha Finner, MD, Jacquelyn Jaci Shaw, RN,                            Rolin Doss, Technician Referring MD:             Emergency department team Medicines:                Monitored Anesthesia Care Complications:            No immediate complications. Estimated Blood Loss:     Estimated blood loss: none. Procedure:                Pre-Anesthesia Assessment:                           - Prior to the procedure, a History and Physical                            was performed, and patient medications and                            allergies were reviewed. The patient's tolerance of                            previous anesthesia was also reviewed. The risks                            and benefits of the procedure and the sedation                            options and risks were discussed with the patient.                            All questions were answered, and informed consent                            was obtained. Prior Anticoagulants: The patient has                            taken no anticoagulant or antiplatelet agents. ASA                            Grade Assessment: II - A patient with mild systemic  disease. After reviewing the risks and benefits,                            the patient was deemed in satisfactory condition to                            undergo the procedure.                           After obtaining informed consent, the endoscope was                            passed under direct vision. Throughout the                             procedure, the patient's blood pressure, pulse, and                            oxygen saturations were monitored continuously. The                            [id number] ([id number]) Therapeutic endoscope was                            introduced through the mouth, and advanced to the                            second part of duodenum. The upper GI endoscopy was                            accomplished without difficulty. The patient                            tolerated the procedure. Scope In: Scope Out: Findings:      No gross lesions were noted in the proximal esophagus and in the mid       esophagus.      LA Grade B (one or more mucosal breaks greater than 5 mm, not extending       between the tops of two mucosal folds) esophagitis with no bleeding was       found in the distal esophagus.      The Z-line was irregular and was found 39 cm from the incisors.      A 2 cm hiatal hernia was present.      5 alkaline batteries were found in the gastric body. Removal was       accomplished with a snare (carefully taken out 1 at a time).      Patchy mild inflammation characterized by congestion (edema), erosions       and erythema was found in the gastric body and in the gastric antrum.      Patchy mild inflammation characterized by congestion (edema) and       erythema was found in the duodenal bulb, in the first portion of the       duodenum, in the second portion of the duodenum and in the third portion  of the duodenum.      The major papilla was normal. Impression:               - No gross lesions in the proximal esophagus and in                            the mid esophagus.                           - LA Grade B esophagitis with no bleeding found                            distally.                           - Z-line irregular, 39 cm from the incisors.                           - 2 cm hiatal hernia.                           - An alkaline battery was found in the stomach.                             Removal was successful.                           - Gastritis.                           - Duodenitis.                           - Normal major papilla. Recommendation:           - The patient will be observed post-procedure,                            until all discharge criteria are met.                           - Return patient to the ED for possible discharge                            same day.                           - Observe patient's clinical course.                           - Recommend repeat KUB. Although not anticipated                            that any batteries left the Upper GI tract before                            EGD performed (and I count what appears to be 5  batteries on the KUB) the patient had said he                            ingested 6-8 batteries.                           - If any batteries or foreign body objects are                            noted within the small bowel, they are out of reach                            of the endoscope. These will need to be monitored                            with periodic KUBs. If abdominal pain progresses or                            worsens reevaluate with imaging thereafter, with                            the hope that these pass on their own. Can use                            laxative therapy such as MiraLAX  once to twice                            daily with Dulcolax every other day until they pass.                           - Recommend patient be on omeprazole 40 mg daily.                           - Recommend as outpatient, patient be considered                            for H. pylori stool testing with his PCP.                           - Hopefully patient does not have further episodes                            of foreign body ingestion, as 1 day he will                            increase his risk of having complications during                             endoscopy.                           - Should the patient return to the hospital, he  will be considered an unassigned patient at the                            hospital from a GI standpoint.                           - The findings and recommendations were discussed                            with the patient.                           - The findings and recommendations were discussed                            with the referring physician. Procedure Code(s):        --- Professional ---                           [phone], Esophagogastroduodenoscopy, flexible,                            transoral; with removal of foreign body(s) Diagnosis Code(s):        --- Professional ---                           K20.90, Esophagitis, unspecified without bleeding                           K22.89, Other specified disease of esophagus                           K44.9, Diaphragmatic hernia without obstruction or                            gangrene                           T18.2XXA, Foreign body in stomach, initial encounter                           K29.70, Gastritis, unspecified, without bleeding                           K29.80, Duodenitis without bleeding                           R10.13, Epigastric pain CPT copyright 2022 American Medical Association. All rights reserved. The codes documented in this report are preliminary and upon coder review may  be revised to meet current compliance requirements. Aloha Finner, MD 01/06/2024 8:50:33 PM Number of Addenda: 0

## 2024-01-06 NOTE — ED Notes (Signed)
 [place] Sheriff at bedside with patient. Pt alert and calm at this time. Respirations even/regular, equal chest expansion.

## 2024-01-06 NOTE — Anesthesia Preprocedure Evaluation (Addendum)
 Anesthesia Evaluation  Patient identified by MRN, date of birth, ID band Patient awake    Reviewed: Allergy & Precautions, NPO status , Patient's Chart, lab work & pertinent test results  Airway Mallampati: II  TM Distance: >3 FB     Dental no notable dental hx. (+) Dental Advisory Given   Pulmonary neg pulmonary ROS, Current Smoker and Patient abstained from smoking.   Pulmonary exam normal breath sounds clear to auscultation       Cardiovascular negative cardio ROS Normal cardiovascular exam Rhythm:Regular Rate:Normal     Neuro/Psych  PSYCHIATRIC DISORDERS  Depression    Psychosis- drug induced Hallucinations    GI/Hepatic ,,,(+)     substance abuse  cocaine useNo drug use currently as he is in custody FB esophagus/stomach Patient reportedly swallowed 6-8 AAA batteries   Endo/Other  negative endocrine ROS    Renal/GU negative Renal ROS     Musculoskeletal negative musculoskeletal ROS (+)    Abdominal   Peds  Hematology negative hematology ROS (+)   Anesthesia Other Findings Patient in custody of [place] Sheriff's officers  Reproductive/Obstetrics                             Anesthesia Physical Anesthesia Plan  ASA: 2 and emergent  Anesthesia Plan: General   Post-op Pain Management: Minimal or no pain anticipated   Induction: Intravenous, Cricoid pressure planned and Rapid sequence  PONV Risk Score and Plan: 1 and Treatment may vary due to age or medical condition and Ondansetron   Airway Management Planned: Oral ETT  Additional Equipment: None  Intra-op Plan:   Post-operative Plan: Extubation in OR  Informed Consent: I have reviewed the patients History and Physical, chart, labs and discussed the procedure including the risks, benefits and alternatives for the proposed anesthesia with the patient or authorized representative who has indicated his/her understanding  and acceptance.     Dental advisory given  Plan Discussed with: CRNA and Anesthesiologist  Anesthesia Plan Comments:         Anesthesia Quick Evaluation

## 2024-01-06 NOTE — Anesthesia Procedure Notes (Signed)
 Procedure Name: Intubation Date/Time: 01/06/2024 8:09 PM  Performed by: Savana Spina C., CRNAPre-anesthesia Checklist: Patient identified, Emergency Drugs available, Suction available, Patient being monitored and Timeout performed Patient Re-evaluated:Patient Re-evaluated prior to induction Oxygen Delivery Method: Circle system utilized Preoxygenation: Pre-oxygenation with 100% oxygen Induction Type: IV induction and Rapid sequence Laryngoscope Size: Mac and 4 Grade View: Grade I Tube type: Oral Tube size: 7.5 mm Number of attempts: 1 Airway Equipment and Method: Stylet Placement Confirmation: ETT inserted through vocal cords under direct vision, positive ETCO2 and breath sounds checked- equal and bilateral Secured at: 23 cm Tube secured with: Tape Dental Injury: Teeth and Oropharynx as per pre-operative assessment

## 2024-01-06 NOTE — Transfer of Care (Signed)
 Immediate Anesthesia Transfer of Care Note  Patient: William Macdonald  Procedure(s) Performed: ESOPHAGOGASTRODUODENOSCOPY (EGD) FOREIGN BODY REMOVAL  Patient Location: PACU  Anesthesia Type:General  Level of Consciousness: awake, alert , and oriented  Airway & Oxygen Therapy: Patient Spontanous Breathing and Patient connected to nasal cannula oxygen  Post-op Assessment: Report given to RN and Post -op Vital signs reviewed and stable  Post vital signs: Reviewed and stable  Last Vitals:  Vitals Value Taken Time  BP 129/89 01/06/24 2046  Temp    Pulse 66 01/06/24 2047  Resp 12 01/06/24 2047  SpO2 98 % 01/06/24 2047  Vitals shown include unfiled device data.  Last Pain:  Vitals:   01/06/24 1935  TempSrc: Temporal  PainSc: 7          Complications: No notable events documented.

## 2024-01-06 NOTE — H&P (Signed)
 GASTROENTEROLOGY PROCEDURE H&P NOTE   Primary Care Physician: Pcp, No  HPI: William Macdonald is a 23 y.o. male who presents for EGD for attempt at removal of foreign body (batteries in stomach).  History reviewed. No pertinent past medical history. Past Surgical History:  Procedure Laterality Date   TONSILLECTOMY     No current facility-administered medications for this encounter.   No current facility-administered medications for this encounter. Allergies  Allergen Reactions   Zyprexa [Olanzapine] Other (See Comments)    Muscle spams   Trileptal [Oxcarbazepine]     Leg spasms and muscle tension   History reviewed. No pertinent family history. Social History   Socioeconomic History   Marital status: Single    Spouse name: Not on file   Number of children: Not on file   Years of education: Not on file   Highest education level: Not on file  Occupational History   Not on file  Tobacco Use   Smoking status: Every Day    Types: Cigarettes    Passive exposure: Yes   Smokeless tobacco: Never  Substance and Sexual Activity   Alcohol use: Yes    Comment: occ   Drug use: Yes    Types: Cocaine    Comment: Heroin   Sexual activity: Not on file  Other Topics Concern   Not on file  Social History Narrative   Not on file   Social Drivers of Health   Financial Resource Strain: Not on file  Food Insecurity: No Food Insecurity (12/03/2023)   Hunger Vital Sign    Worried About Running Out of Food in the Last Year: Never true    Ran Out of Food in the Last Year: Never true  Transportation Needs: No Transportation Needs (12/03/2023)   PRAPARE - Administrator, Civil Service (Medical): No    Lack of Transportation (Non-Medical): No  Physical Activity: Not on file  Stress: Not on file  Social Connections: Unknown (04/27/2022)   [facility]   Social Network    Social Network: Not on file  Intimate Partner Violence: Not At Risk  (12/03/2023)   Humiliation, Afraid, Rape, and Kick questionnaire    Fear of Current or Ex-Partner: No    Emotionally Abused: No    Physically Abused: No    Sexually Abused: No    Physical Exam: Today's Vitals   01/06/24 1656 01/06/24 1659 01/06/24 1935  BP:  (!) 135/111 (!) 132/97  Pulse:  63 63  Resp:  15 16  Temp:  98.3 F (36.8 C) 97.6 F (36.4 C)  TempSrc:  Oral Temporal  SpO2:  100% 100%  Weight:   72.6 kg  Height:   5' 8 (1.727 m)  PainSc: 10-Worst pain ever  7    Body mass index is 24.33 kg/m. GEN: NAD EYE: Sclerae anicteric ENT: MMM CV: Non-tachycardic GI: Soft, 6-7/10 pain currently NEURO:  Alert & Oriented x 3  Lab Results: Recent Labs    01/06/24 1800  WBC 5.2  HGB 14.6  HCT 44.9  PLT 190   BMET Recent Labs    01/06/24 1800  NA 138  K 3.9  CL 102  CO2 24  GLUCOSE 94  BUN 7  CREATININE 0.68  CALCIUM 9.4   LFT No results for input(s): PROT, ALBUMIN, AST, ALT, ALKPHOS, BILITOT, BILIDIR, IBILI in the last 72 hours. PT/INR No results for input(s): LABPROT, INR in the last 72 hours.   Impression /  Plan: This is a 23 y.o.male who presents for EGD for attempt at removal of foreign body (batteries in stomach).  The risks and benefits of endoscopic evaluation/treatment were discussed with the patient and/or family; these include but are not limited to the risk of perforation, infection, bleeding, missed lesions, lack of diagnosis, severe illness requiring hospitalization, as well as anesthesia and sedation related illnesses.  The patient's history has been reviewed, patient examined, no change in status, and deemed stable for procedure.  The patient and/or family is agreeable to proceed.    Aloha Finner, MD [facility] Office # [id number]

## 2024-01-06 NOTE — Consult Note (Signed)
 Gastroenterology Inpatient Consultation   Attending Requesting Consult Long, Fonda [name], MD  [name] Day: 1  Reason for Consult Foreign body ingestion    History of Present Illness  William Macdonald is a 23 y.o. male with a pmh significant for bipolar disorder, MDD, ADHD,?Schizophrenia.  The patient presents to the ED for further evaluation of foreign body ingestion.  The patient the patient presents to the emergency department today.  He reports that he swallowed multiple AAA batteries per his report earlier this afternoon, as he did not want to be in jail.  Since then he has had abdominal discomfort/pain (6-7 out of 10 sharp stabbing pain) within the upper abdomen with significant nausea.  He has not been vomiting.  He recently came into the hospital with a foreign body ingestion of a reported razor blade and this was just monitored and he was eventually discharged back to jail.  He has been in and out of the emergency department over the course of the last year multiple times.  For multiple issues including mental health issues.  The patient has never had an upper endoscopy previously.   GI Review of Systems Positive as above Negative for dysphagia, odynophagia, change in bowel habits, melena, hematochezia  Review of Systems  General: Denies fevers/chills HEENT: Positive for sore throat Cardiovascular: Denies chest pain Pulmonary: Denies shortness of breath Gastroenterological: See HPI Genitourinary: Denies darkened urine Hematological: Denies easy bruising/bleeding Dermatological: Denies jaundice Psychological: Mood is worried per his report  Histories  Past Medical History History reviewed. No pertinent past medical history. Past Surgical History:  Procedure Laterality Date   TONSILLECTOMY      Allergies Allergies  Allergen Reactions   Zyprexa [Olanzapine] Other (See Comments)    Muscle spams   Trileptal [Oxcarbazepine]     Leg spasms and muscle tension     Family History History reviewed. No pertinent family history.  Social History Social History   Socioeconomic History   Marital status: Single    Spouse name: Not on file   Number of children: Not on file   Years of education: Not on file   Highest education level: Not on file  Occupational History   Not on file  Tobacco Use   Smoking status: Every Day    Types: Cigarettes    Passive exposure: Yes   Smokeless tobacco: Never  Substance and Sexual Activity   Alcohol use: Yes    Comment: occ   Drug use: Yes    Types: Cocaine    Comment: Heroin   Sexual activity: Not on file  Other Topics Concern   Not on file  Social History Narrative   Not on file   Social Drivers of Health   Financial Resource Strain: Not on file  Food Insecurity: No Food Insecurity (12/03/2023)   Hunger Vital Sign    Worried About Running Out of Food in the Last Year: Never true    Ran Out of Food in the Last Year: Never true  Transportation Needs: No Transportation Needs (12/03/2023)   PRAPARE - Administrator, Civil Service (Medical): No    Lack of Transportation (Non-Medical): No  Physical Activity: Not on file  Stress: Not on file  Social Connections: Unknown (04/27/2022)   Received from [facility]   Social Network    Social Network: Not on file  Intimate Partner Violence: Not At Risk (12/03/2023)   Humiliation, Afraid, Rape, and Kick questionnaire    Fear  of Current or Ex-Partner: No    Emotionally Abused: No    Physically Abused: No    Sexually Abused: No   The patient denies Drug, Alcohol, or Tobacco use.   Occupation is Travel   Medications  Home Medications No current facility-administered medications on file prior to encounter.   Current Outpatient Medications on File Prior to Encounter  Medication Sig Dispense Refill   risperiDONE  (RISPERDAL ) 0.5 MG tablet Take 1 tablet (0.5 mg total) by mouth at bedtime. 30 tablet 0   acetaminophen  (TYLENOL )  500 MG tablet Take 2 tablets (1,000 mg total) by mouth every 6 (six) hours as needed for mild pain (pain score 1-3) or moderate pain (pain score 4-6). (Patient not taking: Reported on 01/06/2024) 30 tablet 0   ARIPiprazole  (ABILIFY ) 10 MG tablet Take 1 tablet (10 mg total) by mouth at bedtime. (Patient not taking: Reported on 01/06/2024) 30 tablet 0   bacitracin  ointment Apply topically 3 (three) times daily. (Patient not taking: Reported on 01/06/2024) 113.6 g 0   sertraline  (ZOLOFT ) 50 MG tablet Take 1 tablet (50 mg total) by mouth daily. (Patient not taking: Reported on 01/06/2024) 30 tablet 0   Scheduled Inpatient Medications  Continuous Inpatient Infusions  sodium chloride      PRN Inpatient Medications droperidol , fentaNYL  (SUBLIMAZE ) injection, ondansetron  (ZOFRAN ) IV   Physical Examination  BP 129/89 (BP Location: Right Arm)   Pulse 89   Temp (!) 97.5 F (36.4 C)   Resp 13   Ht 5' 8 (1.727 m)   Wt 72.6 kg   SpO2 98%   BMI 24.33 kg/m  GEN: NAD, appears stated age, doesn't appear chronically ill, officer with him at bedside PSYCH: Cooperative, without pressured speech EYE: Conjunctivae pink, sclerae anicteric ENT: MMM NECK: Supple CV: Nontachycardic RESP: No audible wheezing GI: NABS, soft, TTP in upper abdomen (6-7 out of 10 pain preprocedure), nondistended, volitional guarding present, no rebound  MSK/EXT: No significant lower extremity edema SKIN: No jaundice NEURO:  Alert & Oriented x 3, no focal deficits   Review of Data  I reviewed the following data at the time of this encounter:  Laboratory Studies   Recent Labs  Lab 01/06/24 1800  NA 138  K 3.9  CL 102  CO2 24  BUN 7  CREATININE 0.68  GLUCOSE 94  CALCIUM 9.4   Recent Labs  Lab 12/31/23 1549  AST 83*  ALT 152*  ALKPHOS 120    Recent Labs  Lab 12/31/23 1549 01/06/24 1800  WBC 4.4 5.2  HGB 14.2 14.6  HCT 44.0 44.9  PLT 184 190   No results for input(s): APTT, INR in the last 168  hours.  Imaging Studies  KUB/chest x-ray IMPRESSION: Multiple ingested AAA batteries within the stomach.  GI Procedures and Studies  No relevant studies to review   Assessment  Mr. [name] is a 23 y.o. male with a pmh significant for bipolar disorder, MDD, ADHD,?Schizophrenia.  The patient presents to the ED for further evaluation of foreign body ingestion.  The patient is hemodynamically stable.  Patient has evidence of multiple foreign bodies in his upper GI tract/stomach based on KUB.  Will proceed with endoscopy this evening to remove them to decrease risk of them progressing into the small bowel.  Although the patient reports that these are alkaline batteries, there could be other things in there that may increase his risk for perforation or other complications.  The risks and benefits of endoscopic evaluation were discussed with the patient;  these include but are not limited to the risk of perforation, infection, bleeding, missed lesions, lack of diagnosis, severe illness requiring hospitalization, as well as anesthesia and sedation related illnesses.  The patient and/or family is agreeable to proceed.  If he has a complication, surgery will be needed.  All patient questions were answered to the best of my ability, and the patient agrees to the aforementioned plan of action with follow-up as indicated.   Plan/Recommendations  Remain n.p.o. Proceed with endoscopy tonight Recommendations thereafter to be made after procedures completed   Thank you for this consult.  We will continue to follow.  Please page/call with questions or concerns.   Aloha Finner, MD [facility] Office # [phone]

## 2024-01-06 NOTE — Anesthesia Postprocedure Evaluation (Signed)
 Anesthesia Post Note  Patient: William Macdonald  Procedure(s) Performed: ESOPHAGOGASTRODUODENOSCOPY (EGD) FOREIGN BODY REMOVAL     Patient location during evaluation: PACU Anesthesia Type: General Level of consciousness: awake and alert, patient cooperative and oriented Pain management: pain level controlled Vital Signs Assessment: post-procedure vital signs reviewed and stable Respiratory status: spontaneous breathing, nonlabored ventilation and respiratory function stable Cardiovascular status: blood pressure returned to baseline and stable Postop Assessment: no apparent nausea or vomiting Anesthetic complications: no   No notable events documented.  Last Vitals:  Vitals:   01/06/24 2130 01/06/24 2158  BP: 125/83 122/86  Pulse: (!) 54 (!) 50  Resp: 13 17  Temp: 36.7 C   SpO2: 100% 100%    Last Pain:  Vitals:   01/06/24 2045  TempSrc:   PainSc: 0-No pain                 Khaylee Mcevoy,E. Keeanna Villafranca

## 2024-01-09 ENCOUNTER — Encounter (HOSPITAL_COMMUNITY): Payer: Self-pay | Admitting: Gastroenterology

## 2024-02-24 ENCOUNTER — Emergency Department (HOSPITAL_COMMUNITY)
Admission: EM | Admit: 2024-02-24 | Discharge: 2024-02-24 | Disposition: A | Discharge status: Home / Self Care | Payer: Medicaid Other | Attending: Emergency Medicine | Admitting: Emergency Medicine

## 2024-02-24 DIAGNOSIS — Z5900 Homelessness unspecified: Secondary | ICD-10-CM | POA: Diagnosis not present

## 2024-02-24 DIAGNOSIS — R519 Headache, unspecified: Secondary | ICD-10-CM | POA: Insufficient documentation

## 2024-02-24 LAB — COMPREHENSIVE METABOLIC PANEL
ALT: 75 U/L — ABNORMAL HIGH (ref 0–44)
AST: 35 U/L (ref 15–41)
Albumin: 4.2 g/dL (ref 3.5–5.0)
Alkaline Phosphatase: 126 U/L (ref 38–126)
Anion gap: 9 (ref 5–15)
BUN: 15 mg/dL (ref 6–20)
CO2: 25 mmol/L (ref 22–32)
Calcium: 9 mg/dL (ref 8.9–10.3)
Chloride: 106 mmol/L (ref 98–111)
Creatinine, Ser: 0.5 mg/dL — ABNORMAL LOW (ref 0.61–1.24)
GFR, Estimated: >60 mL/min (ref >60)
Glucose, Bld: 106 mg/dL — ABNORMAL HIGH (ref 70–99)
Potassium: 3.9 mmol/L (ref 3.5–5.1)
Sodium: 140 mmol/L (ref 135–145)
Total Bilirubin: 0.5 mg/dL (ref 0.0–1.2)
Total Protein: 7.3 g/dL (ref 6.5–8.1)

## 2024-02-24 LAB — RAPID URINE DRUG SCREEN, HOSP PERFORMED
Amphetamines: NOT DETECTED
Barbiturates: NOT DETECTED
Benzodiazepines: NOT DETECTED
Cocaine: NOT DETECTED
Opiates: NOT DETECTED
Tetrahydrocannabinol: NOT DETECTED

## 2024-02-24 LAB — CBC
HCT: 41.5 % (ref 39.0–52.0)
Hemoglobin: 13.3 g/dL (ref 13.0–17.0)
MCH: 27.6 pg (ref 26.0–34.0)
MCHC: 32 g/dL (ref 30.0–36.0)
MCV: 86.1 fL (ref 80.0–100.0)
Platelets: 178 10*3/uL (ref 150–400)
RBC: 4.82 MIL/uL (ref 4.22–5.81)
RDW: 16.4 % — ABNORMAL HIGH (ref 11.5–15.5)
WBC: 5.3 10*3/uL (ref 4.0–10.5)
nRBC: 0 % (ref 0.0–0.2)

## 2024-02-24 LAB — ETHANOL: Alcohol, Ethyl (B): 22 mg/dL — ABNORMAL HIGH (ref <10)

## 2024-02-24 LAB — ACETAMINOPHEN LEVEL: Acetaminophen (Tylenol), Serum: <10 ug/mL — ABNORMAL LOW (ref 10–30)

## 2024-02-24 LAB — SALICYLATE LEVEL: Salicylate Lvl: <7 mg/dL — ABNORMAL LOW (ref 7.0–30.0)

## 2024-02-24 NOTE — ED Notes (Signed)
Pt given sandwich, crackers and drink 

## 2024-02-24 NOTE — ED Provider Notes (Signed)
  [place] EMERGENCY DEPARTMENT AT [facility] Provider Note   CSN: [id number] Arrival date & time: 02/24/24  1615     History {Add pertinent medical, surgical, social history, OB history to HPI:1} Chief Complaint  Patient presents with   Hallucinations    William Macdonald is a 23 y.o. male.  HPI      23yo male with history of ADHD, bipolar disorder, homeless, prior notation of malingering, polysubstance dependence, including history of methamphetamine abuse and prior delusoinal parasitosis, history of swallowing foreign bodies while incarcerated    Has noted reports of hallucinations in the past (03/10/2023 [facility])  No past medical history on file.   Home Medications Prior to Admission medications   Medication Sig Start Date End Date Taking? Authorizing Provider  acetaminophen (TYLENOL) 500 MG tablet Take 2 tablets (1,000 mg total) by mouth every 6 (six) hours as needed for mild pain (pain score 1-3) or moderate pain (pain score 4-6). Patient not taking: Reported on 01/06/2024 12/04/23   Rana Snare, DO  ARIPiprazole (ABILIFY) 10 MG tablet Take 1 tablet (10 mg total) by mouth at bedtime. Patient not taking: Reported on 01/06/2024 01/01/24   Jacalyn Lefevre, MD  bacitracin ointment Apply topically 3 (three) times daily. Patient not taking: Reported on 01/06/2024 12/04/23   Rana Snare, DO  pantoprazole (PROTONIX) 40 MG tablet Take 1 tablet (40 mg total) by mouth daily. 01/06/24 02/05/24  Maia Plan, MD  risperiDONE (RISPERDAL) 0.5 MG tablet Take 1 tablet (0.5 mg total) by mouth at bedtime. 12/04/23   Rana Snare, DO  sertraline (ZOLOFT) 50 MG tablet Take 1 tablet (50 mg total) by mouth daily. Patient not taking: Reported on 01/06/2024 01/01/24   Jacalyn Lefevre, MD      Allergies    Zyprexa [olanzapine] and Trileptal [oxcarbazepine]    Review of Systems   Review of Systems  Physical Exam Updated Vital Signs BP (!) 126/90 (BP Location: Right Arm)   Pulse 95    Temp 98 F (36.7 C) (Oral)   Resp 18   SpO2 100%  Physical Exam  ED Results / Procedures / Treatments   Labs (all labs ordered are listed, but only abnormal results are displayed) Labs Reviewed  COMPREHENSIVE METABOLIC PANEL  ETHANOL  SALICYLATE LEVEL  ACETAMINOPHEN LEVEL  CBC  RAPID URINE DRUG SCREEN, HOSP PERFORMED    EKG None  Radiology No results found.  Procedures Procedures  {Document cardiac monitor, telemetry assessment procedure when appropriate:1}  Medications Ordered in ED Medications - No data to display  ED Course/ Medical Decision Making/ A&P   {   Click here for ABCD2, HEART and other calculatorsREFRESH Note before signing :1}                              Medical Decision Making Amount and/or Complexity of Data Reviewed Labs: ordered.   ***  {Document critical care time when appropriate:1} {Document review of labs and clinical decision tools ie heart score, Chads2Vasc2 etc:1}  {Document your independent review of radiology images, and any outside records:1} {Document your discussion with family members, caretakers, and with consultants:1} {Document social determinants of health affecting pt's care:1} {Document your decision making why or why not admission, treatments were needed:1} Final Clinical Impression(s) / ED Diagnoses Final diagnoses:  None    Rx / DC Orders ED Discharge Orders     None

## 2024-02-24 NOTE — ED Notes (Signed)
 Pt talked with EDP

## 2024-02-24 NOTE — ED Notes (Signed)
 Patient relocated from [facility] Room 28 to [facility] Room 34.

## 2024-02-24 NOTE — ED Notes (Signed)
Pt showered

## 2024-02-24 NOTE — ED Notes (Signed)
 Pt moved back to room 34.  Immediately asking multiple questions and used the phone w/o anyone answering on the other end.

## 2024-02-24 NOTE — ED Triage Notes (Signed)
 Patient in today from jail reporting hallucinations but does not elaborate. States that he was seen at a behavioral health center in which he had brain surgery and is not concerned about it. States that things are crawling around in it.

## 2024-03-03 ENCOUNTER — Emergency Department (HOSPITAL_COMMUNITY)

## 2024-03-03 ENCOUNTER — Emergency Department (HOSPITAL_COMMUNITY)
Admission: EM | Admit: 2024-03-03 | Discharge: 2024-03-04 | Disposition: A | Discharge status: Home / Self Care | Attending: Emergency Medicine | Admitting: Emergency Medicine

## 2024-03-03 DIAGNOSIS — T50901A Poisoning by unspecified drugs, medicaments and biological substances, accidental (unintentional), initial encounter: Secondary | ICD-10-CM

## 2024-03-03 DIAGNOSIS — R4182 Altered mental status, unspecified: Secondary | ICD-10-CM

## 2024-03-03 DIAGNOSIS — R21 Rash and other nonspecific skin eruption: Secondary | ICD-10-CM | POA: Insufficient documentation

## 2024-03-03 DIAGNOSIS — R000 Tachycardia, unspecified: Secondary | ICD-10-CM | POA: Diagnosis not present

## 2024-03-03 DIAGNOSIS — R0902 Hypoxemia: Secondary | ICD-10-CM | POA: Insufficient documentation

## 2024-03-03 LAB — CBC WITH DIFFERENTIAL/PLATELET
Abs Immature Granulocytes: 0.06 10*3/uL (ref 0.00–0.07)
Basophils Absolute: 0 10*3/uL (ref 0.0–0.1)
Basophils Relative: 0 %
Eosinophils Absolute: 0.1 10*3/uL (ref 0.0–0.5)
Eosinophils Relative: 1 %
HCT: 37.8 % — ABNORMAL LOW (ref 39.0–52.0)
Hemoglobin: 11.7 g/dL — ABNORMAL LOW (ref 13.0–17.0)
Immature Granulocytes: 1 %
Lymphocytes Relative: 28 %
Lymphs Abs: 1.9 10*3/uL (ref 0.7–4.0)
MCH: 28.2 pg (ref 26.0–34.0)
MCHC: 31 g/dL (ref 30.0–36.0)
MCV: 91.1 fL (ref 80.0–100.0)
Monocytes Absolute: 0.8 10*3/uL (ref 0.1–1.0)
Monocytes Relative: 12 %
Neutro Abs: 3.9 10*3/uL (ref 1.7–7.7)
Neutrophils Relative %: 58 %
Platelets: 307 10*3/uL (ref 150–400)
RBC: 4.15 MIL/uL — ABNORMAL LOW (ref 4.22–5.81)
RDW: 16.7 % — ABNORMAL HIGH (ref 11.5–15.5)
WBC: 6.7 10*3/uL (ref 4.0–10.5)
nRBC: 0 % (ref 0.0–0.2)

## 2024-03-03 LAB — COMPREHENSIVE METABOLIC PANEL
ALT: 21 U/L (ref 0–44)
AST: 28 U/L (ref 15–41)
Albumin: 3.5 g/dL (ref 3.5–5.0)
Alkaline Phosphatase: 115 U/L (ref 38–126)
Anion gap: 9 (ref 5–15)
BUN: 14 mg/dL (ref 6–20)
CO2: 22 mmol/L (ref 22–32)
Calcium: 8 mg/dL — ABNORMAL LOW (ref 8.9–10.3)
Chloride: 108 mmol/L (ref 98–111)
Creatinine, Ser: 0.68 mg/dL (ref 0.61–1.24)
GFR, Estimated: >60 mL/min (ref >60)
Glucose, Bld: 124 mg/dL — ABNORMAL HIGH (ref 70–99)
Potassium: 4.4 mmol/L (ref 3.5–5.1)
Sodium: 139 mmol/L (ref 135–145)
Total Bilirubin: 0.7 mg/dL (ref 0.0–1.2)
Total Protein: 6.8 g/dL (ref 6.5–8.1)

## 2024-03-03 LAB — ETHANOL: Alcohol, Ethyl (B): <10 mg/dL (ref <10)

## 2024-03-03 LAB — RESP PANEL BY RT-PCR (RSV, FLU A&B, COVID)  RVPGX2
Influenza A by PCR: NEGATIVE
Influenza B by PCR: NEGATIVE
Resp Syncytial Virus by PCR: NEGATIVE
SARS Coronavirus 2 by RT PCR: NEGATIVE

## 2024-03-03 MED ORDER — SODIUM CHLORIDE 0.9 % IV BOLUS
1000.0000 mL | Freq: Once | INTRAVENOUS | Status: AC
  Administered 2024-03-03: 1000 mL via INTRAVENOUS

## 2024-03-03 NOTE — ED Notes (Signed)
Pt refusing EKG.  MD notified.

## 2024-03-03 NOTE — ED Provider Notes (Signed)
 [place] EMERGENCY DEPARTMENT AT [facility] Provider Note   CSN: [id number] Arrival date & time: 03/03/24  1850     History  Chief Complaint  Patient presents with   Altered Mental Status    William Macdonald is a 23 y.o. male.   Altered Mental Status Patient brought in from behind the [facility].  Reportedly got out of jail.  Found to be hypoxic.  Reportedly had his pants down.  Reported small pupils.  Patient really cannot provide any history.    No past medical history on file.  Home Medications Prior to Admission medications   Not on File      Allergies    Patient has no known allergies.    Review of Systems   Review of Systems  Physical Exam Updated Vital Signs BP (!) 142/76   Pulse (!) 116   Temp (!) 96 F (35.6 C) (Oral)   Resp (!) 30   Ht 5\' 9"  (1.753 m)   Wt 81.6 kg   SpO2 96%   BMI 26.58 kg/m  Physical Exam Vitals and nursing note reviewed.  HENT:     Head: Atraumatic.  Eyes:     Comments: Pupils constricted.  Cardiovascular:     Rate and Rhythm: Tachycardia present.  Chest:     Chest wall: No tenderness.  Abdominal:     Tenderness: There is no abdominal tenderness.  Skin:    Coloration: Skin is not jaundiced.     Comments: Does have some erythema on face with papular rash.  Also papular rash on extremities.  Neurological:     Comments: Moving extremities, however cannot provide much history.     ED Results / Procedures / Treatments   Labs (all labs ordered are listed, but only abnormal results are displayed) Labs Reviewed  COMPREHENSIVE METABOLIC PANEL - Abnormal; Notable for the following components:      Result Value   Glucose, Bld 124 (*)    Calcium 8.0 (*)    All other components within normal limits  CBC WITH DIFFERENTIAL/PLATELET - Abnormal; Notable for the following components:   RBC 4.15 (*)    Hemoglobin 11.7 (*)    HCT 37.8 (*)    RDW 16.7 (*)    All other components within normal limits  RESP  PANEL BY RT-PCR (RSV, FLU A&B, COVID)  [id number]  ETHANOL  RAPID URINE DRUG SCREEN, HOSP PERFORMED    EKG None  Radiology DG Chest Portable 1 View Result Date: 03/03/2024 CLINICAL DATA:  Rash with left lower lobe rales. EXAM: PORTABLE CHEST 1 VIEW COMPARISON:  January 06, 2024 FINDINGS: The heart size and mediastinal contours are within normal limits. Both lungs are clear. The visualized skeletal structures are unremarkable. IMPRESSION: No active disease. Electronically Signed   By: Aram Candela M.D.   On: 03/03/2024 22:30    Procedures Procedures    Medications Ordered in ED Medications  sodium chloride 0.9 % bolus 1,000 mL (0 mLs Intravenous Stopped 03/03/24 2103)    ED Course/ Medical Decision Making/ A&P                                 Medical Decision Making Amount and/or Complexity of Data Reviewed Labs: ordered. Radiology: ordered.   Patient found disheveled with mental status change.  Has some mulch on his close.  Reportedly was rolling around in the mulch.  Was hypoxic for EMS.  Differential diagnosis includes intoxication but also causes such as infection and pneumonia.  Will get x-ray basic blood work and urinalysis.  Patient still altered but somewhat more awake now.  States he took some drugs and it really got to him.  Still altered however.  Patient's mother called requesting information on the patient.  Discussed with patient and does not want [place] to talk to his mother.  Blood work overall reassuring.  Oxygenation improved. chest x-ray does not show pneumonia.  Awaiting improvement in mental status.  Care turned over to Dr.Penna        Final Clinical Impression(s) / ED Diagnoses Final diagnoses:  None    Rx / DC Orders ED Discharge Orders     None         Benjiman Core, MD 03/03/24 2350

## 2024-03-03 NOTE — ED Notes (Signed)
 Pt removed IV and stopped IV fluids. MD notified

## 2024-03-03 NOTE — ED Triage Notes (Addendum)
 Patient bib ems, rash noted states he has had it since he got out of jail. Scratching all the time. EMS state rales in left lower lobes. Initial o2 for ems was 80% then oxygen came up in the high 90s. Patient states he took E-pills. Blister on bottom of feet Patient found with pants down on side of road and bystander running away with stuff in his hands.

## 2024-03-03 NOTE — ED Notes (Signed)
 Pt taking all medical equipment off. Pt refusing to have vitals taken. MD notified.

## 2024-03-03 NOTE — ED Notes (Signed)
 Pt refused ekg

## 2024-03-03 NOTE — ED Notes (Addendum)
 Pt. Refused EKG. RN made aware and EDP, Pickering,MD.

## 2024-03-04 LAB — RAPID URINE DRUG SCREEN, HOSP PERFORMED
Amphetamines: NOT DETECTED
Barbiturates: NOT DETECTED
Benzodiazepines: NOT DETECTED
Cocaine: POSITIVE — AB
Opiates: NOT DETECTED
Tetrahydrocannabinol: POSITIVE — AB

## 2024-03-04 MED ORDER — NALOXONE HCL 4 MG/0.1ML NA LIQD
1.0000 | Freq: Once | NASAL | Status: AC
  Administered 2024-03-04: 1 via NASAL
  Filled 2024-03-04: qty 4

## 2024-03-04 NOTE — Discharge Instructions (Addendum)
 You were seen in the emergency department after an accidental overdose You tested positive for cocaine and marijuana We provided you with outpatient resources for help with your substance use Return to the emergency department for any concerns of overdose in the future

## 2024-03-04 NOTE — ED Provider Notes (Signed)
  Physical Exam  BP (!) 142/76   Pulse (!) 116   Temp (!) 96 F (35.6 C) (Oral)   Resp (!) 30   Ht 5\' 9"  (1.753 m)   Wt 81.6 kg   SpO2 96%   BMI 26.58 kg/m   Physical Exam Vitals and nursing note reviewed.  HENT:     Head: Normocephalic and atraumatic.  Eyes:     Pupils: Pupils are equal, round, and reactive to light.  Cardiovascular:     Rate and Rhythm: Normal rate and regular rhythm.  Pulmonary:     Effort: Pulmonary effort is normal.     Breath sounds: Normal breath sounds.  Abdominal:     Palpations: Abdomen is soft.     Tenderness: There is no abdominal tenderness.  Skin:    General: Skin is warm and dry.  Neurological:     Mental Status: He is alert.  Psychiatric:        Mood and Affect: Mood normal.     Procedures  Procedures  ED Course / MDM   Clinical Course as of 03/04/24 0648  Sat Mar 04, 2024  0646 UDS positive for cocaine and THC. Patient now more awake.  He reports taking a pill that he found on the ground last night but is not sure it was but states "it felt like I was on heroin".  He denies SI HI.  Declines offer for substance use counseling here today but will provide him with outpatient resources.  Stable for discharge at this time.  Will discharge with take-home Narcan [MP]    Clinical Course User Index [MP] Royanne Foots, DO   Medical Decision Making I, Estelle June DO, have assumed care of this patient from the previous provider reevaluation to sobriety and disposition  Amount and/or Complexity of Data Reviewed Labs: ordered. Radiology: ordered.          Royanne Foots, DO 03/04/24 [phone]

## 2024-03-20 ENCOUNTER — Emergency Department (HOSPITAL_COMMUNITY)
Admission: EM | Admit: 2024-03-20 | Discharge: 2024-03-20 | Disposition: A | Discharge status: Home / Self Care | Attending: Emergency Medicine | Admitting: Emergency Medicine

## 2024-03-20 ENCOUNTER — Encounter (HOSPITAL_COMMUNITY): Payer: Self-pay | Admitting: Emergency Medicine

## 2024-03-20 ENCOUNTER — Other Ambulatory Visit: Payer: Self-pay

## 2024-03-20 ENCOUNTER — Encounter (HOSPITAL_COMMUNITY): Payer: Self-pay | Admitting: Gastroenterology

## 2024-03-20 DIAGNOSIS — T50901A Poisoning by unspecified drugs, medicaments and biological substances, accidental (unintentional), initial encounter: Secondary | ICD-10-CM | POA: Diagnosis present

## 2024-03-20 LAB — COMPREHENSIVE METABOLIC PANEL
ALT: 58 U/L — ABNORMAL HIGH (ref 0–44)
AST: 68 U/L — ABNORMAL HIGH (ref 15–41)
Albumin: 3.5 g/dL (ref 3.5–5.0)
Alkaline Phosphatase: 136 U/L — ABNORMAL HIGH (ref 38–126)
Anion gap: 16 — ABNORMAL HIGH (ref 5–15)
BUN: 14 mg/dL (ref 6–20)
CO2: 15 mmol/L — ABNORMAL LOW (ref 22–32)
Calcium: 8.1 mg/dL — ABNORMAL LOW (ref 8.9–10.3)
Chloride: 102 mmol/L (ref 98–111)
Creatinine, Ser: 0.97 mg/dL (ref 0.61–1.24)
GFR, Estimated: >60 mL/min (ref >60)
Glucose, Bld: 165 mg/dL — ABNORMAL HIGH (ref 70–99)
Potassium: 3.5 mmol/L (ref 3.5–5.1)
Sodium: 133 mmol/L — ABNORMAL LOW (ref 135–145)
Total Bilirubin: 0.4 mg/dL (ref 0.0–1.2)
Total Protein: 6.9 g/dL (ref 6.5–8.1)

## 2024-03-20 LAB — ETHANOL: Alcohol, Ethyl (B): <10 mg/dL (ref <10)

## 2024-03-20 LAB — CBC
HCT: 39.6 % (ref 39.0–52.0)
Hemoglobin: 12.3 g/dL — ABNORMAL LOW (ref 13.0–17.0)
MCH: 27.9 pg (ref 26.0–34.0)
MCHC: 31.1 g/dL (ref 30.0–36.0)
MCV: 89.8 fL (ref 80.0–100.0)
Platelets: 285 10*3/uL (ref 150–400)
RBC: 4.41 MIL/uL (ref 4.22–5.81)
RDW: 14.1 % (ref 11.5–15.5)
WBC: 6.9 10*3/uL (ref 4.0–10.5)
nRBC: 0 % (ref 0.0–0.2)

## 2024-03-20 LAB — SALICYLATE LEVEL: Salicylate Lvl: <7 mg/dL — ABNORMAL LOW (ref 7.0–30.0)

## 2024-03-20 LAB — ACETAMINOPHEN LEVEL: Acetaminophen (Tylenol), Serum: <10 ug/mL — ABNORMAL LOW (ref 10–30)

## 2024-03-20 NOTE — ED Triage Notes (Addendum)
 Pt to ED via [facility] picked up at local [place] where pt had witnessed collapse in front of [employer], not breathing with EMS arrival on scene and had assisted ventilations and nasal trumpet inserted.  Pt given 2mg  intranasal narcan by fire department and 0.5 IV narcan by EMS.    Pt arrives alert to voice, c-collar in place by EMS, speech difficult to understand, EDP at bedside.

## 2024-03-20 NOTE — ED Notes (Signed)
 Pt A&Ox4 at this time, sitting up in bed, asking for drinks and to remove c-collar.  EDP gave clearance to remove collar and water, juice, coffee given.

## 2024-03-20 NOTE — ED Notes (Signed)
 Pt demanding and rude at discharge, refused discharge vitals.  Left paperwork after being reviewed saying "I wont use that shit".

## 2024-03-20 NOTE — ED Provider Notes (Signed)
 [place] EMERGENCY DEPARTMENT AT [facility] Provider Note   CSN: [id number] Arrival date & time: 03/20/24  0410     History  Chief Complaint  Patient presents with   Drug Overdose    William Macdonald is a 22 y.o. male.  Brought to the emergency department by EMS from sheets.  Patient witnessed walking into the store and then collapsed.  EMS was on the scene, initially bagged him.  Prefers providers administered Narcan, additional IV Narcan given by EMS at which point patient awakened.  He appears sleepy at arrival but is answering questions.       Home Medications Prior to Admission medications   Not on File      Allergies    Patient has no known allergies.    Review of Systems   Review of Systems  Physical Exam Updated Vital Signs BP 125/82   Pulse (!) 109   Temp 98.8 F (37.1 C) (Oral)   Resp 14   Ht 5\' 8"  (1.727 m)   Wt 65.7 kg   SpO2 100%   BMI 22.02 kg/m  Physical Exam Vitals and nursing note reviewed.  Constitutional:      General: He is not in acute distress.    Appearance: He is well-developed.  HENT:     Head: Normocephalic and atraumatic.     Mouth/Throat:     Mouth: Mucous membranes are moist.  Eyes:     General: Vision grossly intact. Gaze aligned appropriately.     Extraocular Movements: Extraocular movements intact.     Conjunctiva/sclera: Conjunctivae normal.  Cardiovascular:     Rate and Rhythm: Normal rate and regular rhythm.     Pulses: Normal pulses.     Heart sounds: Normal heart sounds, S1 normal and S2 normal. No murmur heard.    No friction rub. No gallop.  Pulmonary:     Effort: Pulmonary effort is normal. No respiratory distress.     Breath sounds: Normal breath sounds.  Abdominal:     Palpations: Abdomen is soft.     Tenderness: There is no abdominal tenderness. There is no guarding or rebound.     Hernia: No hernia is present.  Musculoskeletal:        General: No swelling.     Cervical back: Full  passive range of motion without pain, normal range of motion and neck supple. No pain with movement, spinous process tenderness or muscular tenderness. Normal range of motion.     Right lower leg: No edema.     Left lower leg: No edema.  Skin:    General: Skin is warm and dry.     Capillary Refill: Capillary refill takes less than 2 seconds.     Findings: No ecchymosis, erythema, lesion or wound.  Neurological:     Mental Status: He is alert and oriented to person, place, and time.     GCS: GCS eye subscore is 4. GCS verbal subscore is 5. GCS motor subscore is 6.     Cranial Nerves: Cranial nerves 2-12 are intact.     Sensory: Sensation is intact.     Motor: Motor function is intact. No weakness or abnormal muscle tone.     Coordination: Coordination is intact.  Psychiatric:        Mood and Affect: Mood normal.        Speech: Speech normal.        Behavior: Behavior normal.     ED Results / Procedures /  Treatments   Labs (all labs ordered are listed, but only abnormal results are displayed) Labs Reviewed  CBC - Abnormal; Notable for the following components:      Result Value   Hemoglobin 12.3 (*)    All other components within normal limits  COMPREHENSIVE METABOLIC PANEL - Abnormal; Notable for the following components:   Sodium 133 (*)    CO2 15 (*)    Glucose, Bld 165 (*)    Calcium 8.1 (*)    AST 68 (*)    ALT 58 (*)    Alkaline Phosphatase 136 (*)    Anion gap 16 (*)    All other components within normal limits  ACETAMINOPHEN LEVEL - Abnormal; Notable for the following components:   Acetaminophen (Tylenol), Serum <10 (*)    All other components within normal limits  SALICYLATE LEVEL - Abnormal; Notable for the following components:   Salicylate Lvl <7.0 (*)    All other components within normal limits  ETHANOL    EKG None  Radiology No results found.  Procedures Procedures    Medications Ordered in ED Medications - No data to display  ED Course/  Medical Decision Making/ A&P                                 Medical Decision Making Amount and/or Complexity of Data Reviewed Labs: ordered.   Presented to the emergency department after unintentional overdose.  He was given Narcan prehospital and arrives awake and alert.  Workup has been largely unremarkable.  No longer wishes to be here in the emergency department, asking for discharge.  Overdose was not intentional or for self-harm.  Will discharge patient.        Final Clinical Impression(s) / ED Diagnoses Final diagnoses:  Accidental overdose, initial encounter    Rx / DC Orders ED Discharge Orders     None         Levina Boyack, Canary Brim, MD 03/20/24 [phone]

## 2024-04-16 ENCOUNTER — Emergency Department (HOSPITAL_COMMUNITY)

## 2024-04-16 ENCOUNTER — Inpatient Hospital Stay (HOSPITAL_COMMUNITY): Admission: EM | Admit: 2024-04-16 | Discharge: 2024-04-18 | DRG: 914 | Attending: Student | Admitting: Student

## 2024-04-16 ENCOUNTER — Encounter (HOSPITAL_COMMUNITY): Payer: Self-pay | Admitting: *Deleted

## 2024-04-16 ENCOUNTER — Other Ambulatory Visit: Payer: Self-pay

## 2024-04-16 DIAGNOSIS — Z888 Allergy status to other drugs, medicaments and biological substances status: Secondary | ICD-10-CM

## 2024-04-16 DIAGNOSIS — Z9102 Food additives allergy status: Secondary | ICD-10-CM

## 2024-04-16 DIAGNOSIS — F4310 Post-traumatic stress disorder, unspecified: Secondary | ICD-10-CM | POA: Diagnosis present

## 2024-04-16 DIAGNOSIS — F1210 Cannabis abuse, uncomplicated: Secondary | ICD-10-CM | POA: Diagnosis present

## 2024-04-16 DIAGNOSIS — R748 Abnormal levels of other serum enzymes: Secondary | ICD-10-CM | POA: Diagnosis not present

## 2024-04-16 DIAGNOSIS — Z9152 Personal history of nonsuicidal self-harm: Secondary | ICD-10-CM

## 2024-04-16 DIAGNOSIS — F1990 Other psychoactive substance use, unspecified, uncomplicated: Secondary | ICD-10-CM | POA: Diagnosis not present

## 2024-04-16 DIAGNOSIS — Y92149 Unspecified place in prison as the place of occurrence of the external cause: Secondary | ICD-10-CM

## 2024-04-16 DIAGNOSIS — Z5900 Homelessness unspecified: Secondary | ICD-10-CM

## 2024-04-16 DIAGNOSIS — R7989 Other specified abnormal findings of blood chemistry: Secondary | ICD-10-CM | POA: Diagnosis present

## 2024-04-16 DIAGNOSIS — T189XXA Foreign body of alimentary tract, part unspecified, initial encounter: Secondary | ICD-10-CM | POA: Diagnosis not present

## 2024-04-16 DIAGNOSIS — T1491XA Suicide attempt, initial encounter: Secondary | ICD-10-CM | POA: Diagnosis not present

## 2024-04-16 DIAGNOSIS — F17290 Nicotine dependence, other tobacco product, uncomplicated: Secondary | ICD-10-CM | POA: Diagnosis present

## 2024-04-16 DIAGNOSIS — R4587 Impulsiveness: Secondary | ICD-10-CM | POA: Diagnosis present

## 2024-04-16 DIAGNOSIS — F1110 Opioid abuse, uncomplicated: Secondary | ICD-10-CM | POA: Diagnosis present

## 2024-04-16 DIAGNOSIS — F430 Acute stress reaction: Secondary | ICD-10-CM | POA: Diagnosis present

## 2024-04-16 DIAGNOSIS — F329 Major depressive disorder, single episode, unspecified: Secondary | ICD-10-CM | POA: Diagnosis present

## 2024-04-16 DIAGNOSIS — Z79899 Other long term (current) drug therapy: Secondary | ICD-10-CM

## 2024-04-16 DIAGNOSIS — T65892A Toxic effect of other specified substances, intentional self-harm, initial encounter: Secondary | ICD-10-CM | POA: Diagnosis present

## 2024-04-16 DIAGNOSIS — W44A0XA Battery unspecified, entering into or through a natural orifice, initial encounter: Secondary | ICD-10-CM | POA: Diagnosis present

## 2024-04-16 DIAGNOSIS — G2581 Restless legs syndrome: Secondary | ICD-10-CM | POA: Diagnosis present

## 2024-04-16 DIAGNOSIS — Z555 Less than a high school diploma: Secondary | ICD-10-CM

## 2024-04-16 DIAGNOSIS — F419 Anxiety disorder, unspecified: Secondary | ICD-10-CM | POA: Diagnosis present

## 2024-04-16 DIAGNOSIS — Z9151 Personal history of suicidal behavior: Secondary | ICD-10-CM

## 2024-04-16 HISTORY — DX: Anxiety disorder, unspecified: F41.9

## 2024-04-16 LAB — CBC
HCT: 47.6 % (ref 39.0–52.0)
Hemoglobin: 14.6 g/dL (ref 13.0–17.0)
MCH: 27.2 pg (ref 26.0–34.0)
MCHC: 30.7 g/dL (ref 30.0–36.0)
MCV: 88.6 fL (ref 80.0–100.0)
Platelets: 207 10*3/uL (ref 150–400)
RBC: 5.37 MIL/uL (ref 4.22–5.81)
RDW: 13.6 % (ref 11.5–15.5)
WBC: 7.1 10*3/uL (ref 4.0–10.5)
nRBC: 0 % (ref 0.0–0.2)

## 2024-04-16 LAB — RAPID URINE DRUG SCREEN, HOSP PERFORMED
Amphetamines: NOT DETECTED
Barbiturates: NOT DETECTED
Benzodiazepines: NOT DETECTED
Cocaine: NOT DETECTED
Opiates: NOT DETECTED
Tetrahydrocannabinol: NOT DETECTED

## 2024-04-16 LAB — COMPREHENSIVE METABOLIC PANEL WITH GFR
ALT: 368 U/L — ABNORMAL HIGH (ref 0–44)
AST: 165 U/L — ABNORMAL HIGH (ref 15–41)
Albumin: 4.4 g/dL (ref 3.5–5.0)
Alkaline Phosphatase: 141 U/L — ABNORMAL HIGH (ref 38–126)
Anion gap: 9 (ref 5–15)
BUN: 12 mg/dL (ref 6–20)
CO2: 28 mmol/L (ref 22–32)
Calcium: 9.3 mg/dL (ref 8.9–10.3)
Chloride: 100 mmol/L (ref 98–111)
Creatinine, Ser: 0.56 mg/dL — ABNORMAL LOW (ref 0.61–1.24)
GFR, Estimated: >60 mL/min (ref >60)
Glucose, Bld: 82 mg/dL (ref 70–99)
Potassium: 3.9 mmol/L (ref 3.5–5.1)
Sodium: 137 mmol/L (ref 135–145)
Total Bilirubin: 0.7 mg/dL (ref 0.0–1.2)
Total Protein: 8.6 g/dL — ABNORMAL HIGH (ref 6.5–8.1)

## 2024-04-16 LAB — SALICYLATE LEVEL: Salicylate Lvl: <7 mg/dL — ABNORMAL LOW (ref 7.0–30.0)

## 2024-04-16 LAB — MAGNESIUM: Magnesium: 2.3 mg/dL (ref 1.7–2.4)

## 2024-04-16 LAB — PHOSPHORUS: Phosphorus: 4.4 mg/dL (ref 2.5–4.6)

## 2024-04-16 LAB — ACETAMINOPHEN LEVEL: Acetaminophen (Tylenol), Serum: <10 ug/mL — ABNORMAL LOW (ref 10–30)

## 2024-04-16 LAB — ETHANOL: Alcohol, Ethyl (B): <10 mg/dL (ref <10)

## 2024-04-16 MED ORDER — PEG 3350-KCL-NA BICARB-NACL 420 G PO SOLR
4000.0000 mL | Freq: Once | ORAL | Status: AC
  Administered 2024-04-16: 4000 mL via ORAL

## 2024-04-16 MED ORDER — FLUOXETINE HCL 10 MG PO CAPS
10.0000 mg | ORAL_CAPSULE | Freq: Every day | ORAL | Status: DC
  Administered 2024-04-16 – 2024-04-17 (×2): 10 mg via ORAL
  Filled 2024-04-16 (×2): qty 1

## 2024-04-16 MED ORDER — PROCHLORPERAZINE EDISYLATE 10 MG/2ML IJ SOLN
5.0000 mg | Freq: Four times a day (QID) | INTRAMUSCULAR | Status: DC | PRN
Start: 2024-04-16 — End: 2024-04-18

## 2024-04-16 MED ORDER — LACTATED RINGERS IV SOLN: INTRAVENOUS | Status: AC

## 2024-04-16 MED ORDER — MELATONIN 5 MG PO TABS: 5.0000 mg | ORAL_TABLET | Freq: Every evening | ORAL | Status: DC | PRN

## 2024-04-16 MED ORDER — POLYETHYLENE GLYCOL 3350 17 G PO PACK: 17.0000 g | PACK | Freq: Every day | ORAL | Status: DC | PRN

## 2024-04-16 MED ORDER — PANTOPRAZOLE SODIUM 40 MG PO TBEC
40.0000 mg | DELAYED_RELEASE_TABLET | Freq: Two times a day (BID) | ORAL | Status: DC
  Administered 2024-04-16 – 2024-04-18 (×5): 40 mg via ORAL
  Filled 2024-04-16 (×5): qty 1

## 2024-04-16 MED ORDER — TRAZODONE HCL 50 MG PO TABS
50.0000 mg | ORAL_TABLET | Freq: Every day | ORAL | Status: DC
  Administered 2024-04-16: 50 mg via ORAL
  Filled 2024-04-16: qty 1

## 2024-04-16 MED ORDER — SENNA 8.6 MG PO TABS
1.0000 | ORAL_TABLET | Freq: Two times a day (BID) | ORAL | Status: DC
  Filled 2024-04-16: qty 1

## 2024-04-16 MED ORDER — LACTULOSE 10 GM/15ML PO SOLN
20.0000 g | Freq: Two times a day (BID) | ORAL | Status: DC
  Administered 2024-04-16: 20 g via ORAL
  Filled 2024-04-16: qty 30

## 2024-04-16 NOTE — ED Triage Notes (Signed)
 [place], stating he swallowed 11 batteries before midnight. Reports that he "peeled the negative sides" off of some of them. Also swallowed a "shard of concrete paint" at the same time.  He says they were the "skinny panosonic" kind of batteries. Has not had anything to eat or drink since ingestion. Left sided abdominal pain. Endorses he did this because he was suicidal.

## 2024-04-16 NOTE — Consult Note (Addendum)
 [facility] Psychiatric Consult Initial  Patient Name: .William Macdonald  MRN: [id number]  DOB: January 29, 2001  Consult Order details:  Orders (From admission, onward)     Start     Ordered   04/16/24 0448  IP CONSULT TO PSYCHIATRY       Ordering Provider: Bary Boss, DO  Provider:  (Not yet assigned)  Question Answer Comment  Location [facility]   Reason for Consult? Suicidal ideation and attempt by swallowing 11 AAA batteries.      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  PTSD 3.  Polysubstance use d/o, including opiates  Assessment  William Macdonald is a 23 y.o. male admitted: Medicallyfor 04/16/2024  2:57 AM for swallowing 11-AAA batteries and a piece of concrete paint in prison. He carries the psychiatric diagnoses of Stress reaction with mixed disturbance of of emotion and conduct, PTSD, and polysubstance use d/o, including opiates with a negative medical history.   His current presentation of impulsive, manipulative behavior, swallowing foreign objected, dysphoric mood, and worry is most consistent with stress reaction with mixed disturbance of emotion and conduct. 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  Current outpatient psychotropic medications include Remeron 15 mg at bedtime and historically he has had a positive response to these medications with restless legs side effects. He was compliant with medications prior to admission as evidenced  by self-report. On initial examination, patient was cooperative and pleasant with some physical discomfort, GI pain. Please see plan below for detailed recommendations.   Diagnoses:  Active Hospital problems: Principal Problem:   Swallowed foreign body, initial encounter Active Problems:   PTSD (post-traumatic stress disorder)    Plan   ## Psychiatric Medication Recommendations:  -Started Trazodone  50 mg at bedtime -Discontinued Remeron 15 mg at bedtime due to restless legs -Started Prozac  10 mg daily  ## Medical Decision Making Capacity: Not specifically addressed in this encounter  ## Further Work-up:  -- EKG ordered -- most recent EKG on 03/20/2024 had QtC of 507 after using cocaine and marijuana -- Pertinent labwork reviewed earlier this admission includes: CBC, Chem Panel, and upper endoscopy   ## Disposition:-- There are no psychiatric contraindications to discharge at this time  ## Behavioral / Environmental: - No specific recommendations at this time.     ## Safety and Observation Level:  - Based on my clinical evaluation, I estimate the patient to be at moderate risk of self harm in the current setting. - At this time, we recommend  1:1 Observation. This decision is based on my review of the chart including patient's history and current presentation, interview of the patient, mental status examination, and consideration of suicide risk including evaluating suicidal ideation, plan, intent, suicidal or self-harm behaviors, risk factors, and protective factors. This judgment is based on our ability to directly address suicide risk, implement suicide prevention strategies, and develop a safety plan while the patient is in the clinical setting.  Please contact our team if there is a concern that risk level has changed.  CSSR Risk Category:C-SSRS RISK CATEGORY: High Risk  Suicide Risk Assessment: Patient has following modifiable risk factors for suicide: under treated depression  , which we are addressing by changing medications. Patient has following non-modifiable or demographic risk factors for suicide: male gender, history of suicide attempt, history of self harm behavior, and psychiatric hospitalization Patient has the following protective factors against suicide: Access to outpatient mental health care and Supportive family  Thank you for this consult request. Recommendations have been communicated to the primary team.  We will continue to follow at this time.   Roslynn Coombes, NP       History of Present Illness  Relevant Aspects of [facility] Course:  Admitted on 04/16/2024 for swallowing 11-AAA batteries and a piece of concrete paint in prison.   Patient Report:  23 yo male admitted for swallowing 11-AAA batteries and a piece of concrete paint in prison. He carries the psychiatric diagnoses of Stress reaction with mixed disturbance of depression and anxiety, PTSD, and polysubstance use d/o, including opiates with a negative medical history. "When I'm depressed, angry, agitated or challenged, I (swallow things) to get out of situations."  He has a history of swallowing foreign objects since 2018 when he is imprisoned in a detention center as a youth or adult imprisonment.  In the ED, he stated this was a suicide attempt.  During the assessment, he reported it being a way to get out of things.  In 2018, he swallowed a paper clip prior to a detention search to prevent getting in trouble for contraband.  Other times, it was to avoid things in prison.  Denies current suicidal ideations and has a parole hearing this week to get out.  He plans on discharging to [facility] for substance abuse rehab.  On assessment, he was having some abdominal pain and awaiting for objects to pass.  In reference to his depression, "I would say moderate" and then stated moderate to severe, no suicidal ideations.  Past history of cutting in his teen years, no recent cutting episodes.  Anxiety  is moderate to severe with no panic attacks.  Depression and anxiety as an adolescent with one psychiatric admission to [facility] at [age].He does have trauma with nightmares regularly along with flashbacks.  Denied hallucinations, paranoia, and homicidal ideations.  No mania symptoms when not using substances.  Prior to prison, he was "using anything I could get my hands on".  Typically, he used marijuana daily, meth occasionally, cocaine frequently, and IV Fentanyl .  Denies cravings or withdrawal symptoms.  Appetite was "normal" prior to swallowing the objects.  Sleep is fair with Remeron but complained of restless leg syndrome, changed to Trazodone .    He reported his mother is supportive.  He completed 8th grade, special education classes.  "My reading's pretty good, I understand what's right and wrong," volunteered this information.  He does have a child and was living in [facility], homeless prior to incarceration.    Psych ROS:  Depression: moderate Anxiety:  moderate Mania (lifetime and current): none Psychosis: (lifetime and current): past on substances, typically meth  Review of Systems  Constitutional: Negative.   HENT: Negative.    Eyes: Negative.   Respiratory: Negative.    Cardiovascular: Negative.   Gastrointestinal:  Positive for abdominal pain and nausea.  Genitourinary: Negative.   Musculoskeletal: Negative.   Skin: Negative.   Neurological: Negative.   Endo/Heme/Allergies: Negative.  Psychiatric/Behavioral:  Positive for depression. The patient is nervous/anxious and has insomnia.      Psychiatric and Social History  Psychiatric History:  Information collected from patient and chart.  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 Living Situation: prison, homeless prior to imprisonment  Access to weapons/lethal means: none   Substance History Alcohol: denied recent use, past history of abuse per chart  History of alcohol withdrawal seizures denied History of DT's denied Tobacco: vapes nicotine  Illicit drugs: Marijuana, cocaine, meth, and Fentanyl  IV Prescription drug abuse: if available, opiates Rehab hx: past history  Exam Findings  Physical Exam:  Vital Signs:  Temp:  [97.5 F (36.4 C)-97.9 F (36.6 C)] 97.5 F (36.4 C) (04/20 0828) Pulse Rate:  [51-71] 71 (04/20 0828) Resp:  [16-18] 18 (04/20 0828) BP: (107-123)/(66-81) 119/66 (04/20 0828) SpO2:  [96 %-100 %] 100 % (04/20 0828) Weight:  [65.7 kg] 65.7 kg (04/20 0334) Blood pressure 119/66, pulse 71, temperature (!) 97.5 F (36.4 C), temperature source Oral, resp. rate 18, height 5\' 8"  (1.727 m), weight 65.7 kg, SpO2 100%. Body mass index is 22.02 kg/m.  Physical Exam Vitals and nursing note reviewed.  Constitutional:      Appearance: Normal appearance.  HENT:     Head: Normocephalic.     Nose: Nose normal.  Pulmonary:     Effort: Pulmonary effort is normal.  Musculoskeletal:     Cervical back: Normal range of motion.  Neurological:     General: No focal deficit present.     Mental Status: He is alert and oriented to person, place, and time.     Mental Status Exam: General Appearance: Casual  Orientation:  Full (Time, Place, and Person)  Memory:  Immediate;   Good Recent;   Good Remote;   Good  Concentration:  Concentration: Good and Attention Span: Good  Recall:  Good   Attention  Good  Eye Contact:  Fair  Speech:  Normal Rate  Language:  Good  Volume:  Normal  Mood: depressed and anxious  Affect:  Congruent  Thought Process:  Coherent  Thought Content:  Logical  Suicidal Thoughts:  No  Homicidal Thoughts:  No  Judgement:  Fair  Insight:  Fair  Psychomotor Activity:  Decreased  Akathisia:  No  Fund of Knowledge:  Fair      Assets:  Housing Leisure Time Physical Health Resilience  Cognition:  WNL  ADL's:  Intact  AIMS (if indicated):        Other History   These have been pulled in through the EMR, reviewed, and updated if appropriate.  Family History:  The patient's family history is not on file.  Medical History: Past Medical History:  Diagnosis Date   Anxiety     Surgical History: Past Surgical History:  Procedure Laterality Date   ESOPHAGOGASTRODUODENOSCOPY N/A 01/06/2024   Procedure: ESOPHAGOGASTRODUODENOSCOPY (EGD);  Surgeon: Normie Becton., MD;  [facility] ENDOSCOPY;  Service: Gastroenterology;  Laterality: N/A;   FOREIGN BODY REMOVAL  01/06/2024   Procedure: FOREIGN BODY REMOVAL;  Surgeon: Brice Campi Albino Alu., MD;  Location: [facility] ENDOSCOPY;  Service: Gastroenterology;;   TONSILLECTOMY       Medications:   Current Facility-Administered Medications:    lactated ringers  infusion, , Intravenous, Continuous, Bary Boss, DO, Last Rate: 125 mL/hr at 04/16/24 0505, New Bag at 04/16/24 0505   pantoprazole  (PROTONIX ) EC tablet 40 mg, 40 mg, Oral, BID, Althia Atlas, MD   polyethylene glycol-electrolytes (NuLYTELY ) solution 4,000 mL, 4,000 mL, Oral, Once, Althia Atlas, MD   prochlorperazine  (COMPAZINE ) injection 5 mg, 5 mg, Intravenous, Q6H PRN, Hall, Carole N, DO   traZODone  (DESYREL ) tablet 50 mg, 50 mg, Oral, QHS, Cherica Heiden, Wendelin Halsted, NP  Allergies: Allergies  Allergen Reactions   Zyprexa [Olanzapine] Other (See Comments)    Muscle spams   Soy Allergy (Obsolete) Diarrhea   Trileptal [Oxcarbazepine]     Leg  spasms and muscle tension    Roslynn Coombes, NP

## 2024-04-16 NOTE — H&P (Signed)
 History and Physical  DARRAN GABAY AOZ:[id number] DOB: 09-18-2001 DOA: 04/16/2024  Referring physician: Dwaine Gip  PCP: Patient, No Pcp Per  Outpatient Specialists: None Patient coming from: Detention facility.  Chief Complaint: Swallowed foreign body.  HPI: William Macdonald is a 23 y.o. male with medical history significant for major depressive disorder, suicidal ideation, who presents to the ER under [facility] custody after swallowing 11 AAA batteries before midnight.  Associated with left upper quadrant abdominal pain.  Denies nausea, vomiting, subjective fevers, or chills.  The patient has a history of the same that had previously required EGD for removal of foreign body swallowed.  He endorses wanting to kill himself due to his incarceration.  EDP discussed the case with GI on-call who recommended medical management with laxative since AAA batteries can pass through the GI tract.  Lab studies are notable for uptrending elevated LFTs when compared to previous lab work done on 03/20/2024.  [facility], hospitalist service, was asked to admit for Intraven passing of the batteries prior to return to the detention facility.  ED Course: Temperature 97.9.  BP 123/81, pulse 68, respiratory rate 17, O2 saturation 100% on room air.  Lab studies notable for alkaline phosphatase 141, AST 165, ALT 368.  Review of Systems: Review of systems as noted in the HPI. All other systems reviewed and are negative.   Past Medical History:  Diagnosis Date   Anxiety    Past Surgical History:  Procedure Laterality Date   ESOPHAGOGASTRODUODENOSCOPY N/A 01/06/2024   Procedure: ESOPHAGOGASTRODUODENOSCOPY (EGD);  Surgeon: Normie Becton., MD;  Location: [facility] ENDOSCOPY;  Service: Gastroenterology;  Laterality: N/A;   FOREIGN BODY REMOVAL  01/06/2024   Procedure: FOREIGN BODY REMOVAL;  Surgeon: Brice Campi Albino Alu., MD;  Location: [facility] ENDOSCOPY;  Service: Gastroenterology;;   TONSILLECTOMY       Social History:  reports that he has been smoking. He has been exposed to tobacco smoke. He has never used smokeless tobacco. He reports current alcohol use. He reports current drug use. Drug: Cocaine.   Allergies  Allergen Reactions   Zyprexa [Olanzapine] Other (See Comments)    Muscle spams   Trileptal [Oxcarbazepine]     Leg spasms and muscle tension   Family history: None reported.  Prior to Admission medications   Medication Sig Start Date End Date Taking? Authorizing Provider  acetaminophen  (TYLENOL ) 500 MG tablet Take 2 tablets (1,000 mg total) by mouth every 6 (six) hours as needed for mild pain (pain score 1-3) or moderate pain (pain score 4-6). Patient not taking: Reported on 01/06/2024 12/04/23   Zheng, Michael, DO  ARIPiprazole  (ABILIFY ) 10 MG tablet Take 1 tablet (10 mg total) by mouth at bedtime. Patient not taking: Reported on 01/06/2024 01/01/24   Sueellen Emery, MD  bacitracin  ointment Apply topically 3 (three) times daily. Patient not taking: Reported on 01/06/2024 12/04/23   Zheng, Michael, DO  pantoprazole  (PROTONIX ) 40 MG tablet Take 1 tablet (40 mg total) by mouth daily. 01/06/24 02/05/24  Roberts Ching, MD  risperiDONE  (RISPERDAL ) 0.5 MG tablet Take 1 tablet (0.5 mg total) by mouth at bedtime. 12/04/23   Zheng, Michael, DO  sertraline  (ZOLOFT ) 50 MG tablet Take 1 tablet (50 mg total) by mouth daily. Patient not taking: Reported on 01/06/2024 01/01/24   Sueellen Emery, MD    Physical Exam: BP 117/78   Pulse (!) 55   Temp 97.9 F (36.6 C) (Oral)   Resp 16   Ht 5\' 8"  (1.727 m)   Wt  65.7 kg   SpO2 100%   BMI 22.02 kg/m   General: 23 y.o. year-old male well developed well nourished in no acute distress.  Alert and oriented x3. Cardiovascular: Regular rate and rhythm with no rubs or gallops.  No thyromegaly or JVD noted.  No lower extremity edema. 2/4 pulses in all 4 extremities. Respiratory: Clear to auscultation with no wheezes or rales. Good inspiratory  effort. Abdomen: Soft left upper quadrant tenderness.  Nondistended with normal bowel sounds x4 quadrants. Muskuloskeletal: No cyanosis, clubbing or edema noted bilaterally Neuro: CN II-XII intact, strength, sensation, reflexes Skin: No ulcerative lesions noted or rashes Psychiatry: Judgement and insight appear normal. Mood is appropriate for condition and setting          Labs on Admission:  Basic Metabolic Panel: Recent Labs  Lab 04/16/24 0314  NA 137  K 3.9  CL 100  CO2 28  GLUCOSE 82  BUN 12  CREATININE 0.56*  CALCIUM 9.3   Liver Function Tests: Recent Labs  Lab 04/16/24 0314  AST 165*  ALT 368*  ALKPHOS 141*  BILITOT 0.7  PROT 8.6*  ALBUMIN 4.4   No results for input(s): "LIPASE", "AMYLASE" in the last 168 hours. No results for input(s): "AMMONIA" in the last 168 hours. CBC: Recent Labs  Lab 04/16/24 0314  WBC 7.1  HGB 14.6  HCT 47.6  MCV 88.6  PLT 207   Cardiac Enzymes: No results for input(s): "CKTOTAL", "CKMB", "CKMBINDEX", "TROPONINI" in the last 168 hours.  BNP (last 3 results) No results for input(s): "BNP" in the last 8760 hours.  ProBNP (last 3 results) No results for input(s): "PROBNP" in the last 8760 hours.  CBG: No results for input(s): "GLUCAP" in the last 168 hours.  Radiological Exams on Admission: DG Abdomen 1 View Result Date: 04/16/2024 CLINICAL DATA:  23 year old male with ingestion of batteries. EXAM: ABDOMEN - 1 VIEW COMPARISON:  Abdominal radiographs 01/06/2024. FINDINGS: Portable AP supine view at 0306 hours. Numerous, approximately 10 cylindrical battery type foreign bodies (such as AA or AAA battery type) project in the epigastrium over the gastric air. Three similar foreign bodies depicted elsewhere in the abdomen in January are no longer identified. Large bowel retained stool. Non obstructed bowel gas pattern. Negative lung bases. Negative visible osseous structures. IMPRESSION: Numerous, approximately 10 ingested batteries  project in the stomach. Non obstructed bowel gas pattern. Electronically Signed   By: Marlise Simpers M.D.   On: 04/16/2024 04:15    EKG: I independently viewed the EKG done and my findings are as followed: None reported at the time of this visit.  Assessment/Plan Present on Admission:  Swallowed foreign body, initial encounter  Principal Problem:   Swallowed foreign body, initial encounter  Swallowed foreign body, initial encounter The patient endorses swallowing 11 AAA batteries as a suicide attempt History of the same previously foreign body was removed via EGD GI recommended medical management with laxative to induce removal of the batteries through the GI tract. Continue supportive care IV fluid hydration  Elevated liver chemistries, uptrending Alkaline phosphatase 141 from 136 from almost 1 month ago AST 165 from 68 ALT 368 from 58 Repeat chemistry panel Avoid hepatotoxic agents  Psychiatric illness Major depressive disorder With suicidal ideation and attempt Psychiatry consulted   Time: 75 minutes.   DVT prophylaxis: SCDs.  Code Status: Full code  Family Communication: None at bedside.  Disposition Plan: Admitted to MedSurg unit.  Consults called: EDP discussed the case with GI.  Admission status: Observation  status.   Status is: Observation    Bary Boss MD [facility] Pager [phone]  If 7PM-7AM, please contact night-coverage [email or web address] Password [facility]  04/16/2024, 4:28 AM

## 2024-04-16 NOTE — Plan of Care (Signed)
 Patient was seen and examined at bedside during morning rounds, complaining of epigastric and lower abdominal pain, no epigastric discomfort, no chest pain or palpitation, no shortness of breath. Patient is trying to drink GoLytely  And discussed with psych, patient has no suicidal thoughts at this time, no need of involuntary commitment and psych admission.  Started trazodone  50 mg nightly and Prozac  10 mg p.o. daily Started pantoprazole  40 mg p.o. twice daily for GI prophylaxis Continue IV fluid for hydration Discussed with GI, recommended to start GoLytely  GI prep and repeat abdominal x-ray tomorrow a.m., batteries should pass by itself, no intervention at this time.

## 2024-04-16 NOTE — Plan of Care (Signed)

## 2024-04-16 NOTE — ED Provider Notes (Signed)
 [place] EMERGENCY DEPARTMENT AT [facility] Provider Note   CSN: [id number] Arrival date & time: 04/16/24  0254     History  Chief Complaint  Patient presents with   Swallowed Foreign Body    William Macdonald is a 23 y.o. male.  Patient presents to the emergency department in law enforcement custody with reports of ingesting 11 AAA batteries.  He also endorses swallowing a piece of "concrete paint" although he is unable to explain to me what this is.  Patient with history of similar previously which has required EGD for removal.  Patient states he tried to bite the end of 1 or 2 of the batteries before ingesting them.  He endorses wanting to kill himself due to his incarceration.  He complains of some left upper quadrant abdominal pain.  He denies nausea, vomiting.  Past medical history otherwise significant for anxiety, mood disorder, psychosis, psychoactive substance abuse psychosis  HPI     Home Medications Prior to Admission medications   Medication Sig Start Date End Date Taking? Authorizing Provider  acetaminophen  (TYLENOL ) 500 MG tablet Take 2 tablets (1,000 mg total) by mouth every 6 (six) hours as needed for mild pain (pain score 1-3) or moderate pain (pain score 4-6). Patient not taking: Reported on 01/06/2024 12/04/23   Zheng, Michael, DO  ARIPiprazole  (ABILIFY ) 10 MG tablet Take 1 tablet (10 mg total) by mouth at bedtime. Patient not taking: Reported on 01/06/2024 01/01/24   Sueellen Emery, MD  bacitracin  ointment Apply topically 3 (three) times daily. Patient not taking: Reported on 01/06/2024 12/04/23   Zheng, Michael, DO  pantoprazole  (PROTONIX ) 40 MG tablet Take 1 tablet (40 mg total) by mouth daily. 01/06/24 02/05/24  Roberts Ching, MD  risperiDONE  (RISPERDAL ) 0.5 MG tablet Take 1 tablet (0.5 mg total) by mouth at bedtime. 12/04/23   Zheng, Michael, DO  sertraline  (ZOLOFT ) 50 MG tablet Take 1 tablet (50 mg total) by mouth daily. Patient not taking: Reported on  01/06/2024 01/01/24   Sueellen Emery, MD      Allergies    Zyprexa [olanzapine] and Trileptal [oxcarbazepine]    Review of Systems   Review of Systems  Physical Exam Updated Vital Signs BP 117/78   Pulse (!) 55   Temp 97.9 F (36.6 C) (Oral)   Resp 16   Ht 5\' 8"  (1.727 m)   Wt 65.7 kg   SpO2 100%   BMI 22.02 kg/m  Physical Exam Vitals and nursing note reviewed.  Constitutional:      General: He is not in acute distress.    Appearance: He is well-developed.  HENT:     Head: Normocephalic and atraumatic.  Eyes:     Conjunctiva/sclera: Conjunctivae normal.  Cardiovascular:     Rate and Rhythm: Normal rate and regular rhythm.  Pulmonary:     Effort: Pulmonary effort is normal. No respiratory distress.     Breath sounds: Normal breath sounds.  Abdominal:     Palpations: Abdomen is soft.     Tenderness: There is no abdominal tenderness.  Musculoskeletal:        General: No swelling.     Cervical back: Neck supple.  Skin:    General: Skin is warm and dry.     Capillary Refill: Capillary refill takes less than 2 seconds.  Neurological:     Mental Status: He is alert.  Psychiatric:        Mood and Affect: Mood normal.     ED Results /  Procedures / Treatments   Labs (all labs ordered are listed, but only abnormal results are displayed) Labs Reviewed  COMPREHENSIVE METABOLIC PANEL WITH GFR - Abnormal; Notable for the following components:      Result Value   Creatinine, Ser 0.56 (*)    Total Protein 8.6 (*)    AST 165 (*)    ALT 368 (*)    Alkaline Phosphatase 141 (*)    All other components within normal limits  CBC  ETHANOL  SALICYLATE LEVEL  ACETAMINOPHEN  LEVEL  RAPID URINE DRUG SCREEN, HOSP PERFORMED    EKG None  Radiology DG Abdomen 1 View Result Date: 04/16/2024 CLINICAL DATA:  23 year old male with ingestion of batteries. EXAM: ABDOMEN - 1 VIEW COMPARISON:  Abdominal radiographs 01/06/2024. FINDINGS: Portable AP supine view at 0306 hours. Numerous,  approximately 10 cylindrical battery type foreign bodies (such as AA or AAA battery type) project in the epigastrium over the gastric air. Three similar foreign bodies depicted elsewhere in the abdomen in January are no longer identified. Large bowel retained stool. Non obstructed bowel gas pattern. Negative lung bases. Negative visible osseous structures. IMPRESSION: Numerous, approximately 10 ingested batteries project in the stomach. Non obstructed bowel gas pattern. Electronically Signed   By: Marlise Simpers M.D.   On: 04/16/2024 04:15    Procedures Procedures    Medications Ordered in ED Medications - No data to display  ED Course/ Medical Decision Making/ A&P                                 Medical Decision Making Amount and/or Complexity of Data Reviewed Labs: ordered. Radiology: ordered.   This patient presents to the ED for concern of battery ingestion, this involves an extensive number of treatment options, and is a complaint that carries with it a high risk of complications and morbidity.    Co morbidities that complicate the patient evaluation  History of previous ingestion, psychosis, mood disorder   Additional history obtained:  Additional history obtained from law enforcement External records from outside source obtained and reviewed including previous endoscopy showing removal of AAA batteries   Lab Tests:  I Ordered, and personally interpreted labs.  The pertinent results include: Elevated AST, ALT, alkaline phosphatase with values of 165, 368, 141 respectively   Imaging Studies ordered:  I ordered imaging studies including KUB I independently visualized and interpreted imaging which showed AAA batteries in the stomach I agree with the radiologist interpretation   Consultations Obtained:  I requested consultation with the on-call gastroenterologist, Dr. Lavaughn Portland,  and discussed lab and imaging findings as well as pertinent plan - they recommend: Possible  laxative, monitor for passage.  I requested consultation with the hospitalist, Dr. Del Favia, to request admission for observation.   Social Determinants of Health:  Patient is incarcerated   Test / Admission - Considered:  Patient with multiple AAA batteries in the stomach.  On-call gastroenterologist states this does not require EGD for retrieval for this type of battery.  Recommends laxatives and observation for passage.  I do not feel comfortable discharging this patient as this was an intentional ingestion for observation, feel that the patient needs admission for observation to monitor for passage with repeat KUB.  Patient also has elevated liver enzymes.  Once batteries passed patient should be stable for discharge from a medical perspective.  Patient is requesting to speak to psychiatry while in the hospital due to suicidal ideations.  Final Clinical Impression(s) / ED Diagnoses Final diagnoses:  Swallowed foreign body, initial encounter  Elevated liver enzymes    Rx / DC Orders ED Discharge Orders     None         Delories Fetter 04/16/24 0421    Ballard Bongo, MD 04/16/24 [id number]

## 2024-04-16 NOTE — Progress Notes (Signed)
 Patient's case discussed with the ER physician and the hospital team and I believe AAA battery should pass and agree with clear liquids and laxatives and follow-up x-ray to confirm passing and this can be done either as inpatient or outpatient and please let me know if I can be of any further assistance with this hospital stay and will follow for now on the computer please call me as above if specific question or problem

## 2024-04-17 ENCOUNTER — Observation Stay (HOSPITAL_COMMUNITY)

## 2024-04-17 DIAGNOSIS — Z5900 Homelessness unspecified: Secondary | ICD-10-CM | POA: Diagnosis not present

## 2024-04-17 DIAGNOSIS — R7989 Other specified abnormal findings of blood chemistry: Secondary | ICD-10-CM | POA: Diagnosis present

## 2024-04-17 DIAGNOSIS — R4587 Impulsiveness: Secondary | ICD-10-CM | POA: Diagnosis present

## 2024-04-17 DIAGNOSIS — Z9152 Personal history of nonsuicidal self-harm: Secondary | ICD-10-CM | POA: Diagnosis not present

## 2024-04-17 DIAGNOSIS — Z555 Less than a high school diploma: Secondary | ICD-10-CM | POA: Diagnosis not present

## 2024-04-17 DIAGNOSIS — Z79899 Other long term (current) drug therapy: Secondary | ICD-10-CM | POA: Diagnosis not present

## 2024-04-17 DIAGNOSIS — F329 Major depressive disorder, single episode, unspecified: Secondary | ICD-10-CM | POA: Diagnosis present

## 2024-04-17 DIAGNOSIS — F1110 Opioid abuse, uncomplicated: Secondary | ICD-10-CM | POA: Diagnosis present

## 2024-04-17 DIAGNOSIS — R748 Abnormal levels of other serum enzymes: Secondary | ICD-10-CM | POA: Diagnosis present

## 2024-04-17 DIAGNOSIS — Z9151 Personal history of suicidal behavior: Secondary | ICD-10-CM | POA: Diagnosis not present

## 2024-04-17 DIAGNOSIS — F1210 Cannabis abuse, uncomplicated: Secondary | ICD-10-CM | POA: Diagnosis present

## 2024-04-17 DIAGNOSIS — T189XXA Foreign body of alimentary tract, part unspecified, initial encounter: Secondary | ICD-10-CM | POA: Diagnosis present

## 2024-04-17 DIAGNOSIS — Y92149 Unspecified place in prison as the place of occurrence of the external cause: Secondary | ICD-10-CM | POA: Diagnosis not present

## 2024-04-17 DIAGNOSIS — T65892A Toxic effect of other specified substances, intentional self-harm, initial encounter: Secondary | ICD-10-CM | POA: Diagnosis present

## 2024-04-17 DIAGNOSIS — G2581 Restless legs syndrome: Secondary | ICD-10-CM | POA: Diagnosis present

## 2024-04-17 DIAGNOSIS — Z888 Allergy status to other drugs, medicaments and biological substances status: Secondary | ICD-10-CM | POA: Diagnosis not present

## 2024-04-17 DIAGNOSIS — F4310 Post-traumatic stress disorder, unspecified: Secondary | ICD-10-CM | POA: Diagnosis present

## 2024-04-17 DIAGNOSIS — F17290 Nicotine dependence, other tobacco product, uncomplicated: Secondary | ICD-10-CM | POA: Diagnosis present

## 2024-04-17 DIAGNOSIS — Z9102 Food additives allergy status: Secondary | ICD-10-CM | POA: Diagnosis not present

## 2024-04-17 DIAGNOSIS — F419 Anxiety disorder, unspecified: Secondary | ICD-10-CM | POA: Diagnosis present

## 2024-04-17 DIAGNOSIS — T1491XA Suicide attempt, initial encounter: Secondary | ICD-10-CM | POA: Diagnosis present

## 2024-04-17 DIAGNOSIS — W44A0XA Battery unspecified, entering into or through a natural orifice, initial encounter: Secondary | ICD-10-CM | POA: Diagnosis present

## 2024-04-17 LAB — BASIC METABOLIC PANEL WITH GFR
Anion gap: 7 (ref 5–15)
BUN: 9 mg/dL (ref 6–20)
CO2: 29 mmol/L (ref 22–32)
Calcium: 9.6 mg/dL (ref 8.9–10.3)
Chloride: 102 mmol/L (ref 98–111)
Creatinine, Ser: 0.76 mg/dL (ref 0.61–1.24)
GFR, Estimated: >60 mL/min (ref >60)
Glucose, Bld: 92 mg/dL (ref 70–99)
Potassium: 4.6 mmol/L (ref 3.5–5.1)
Sodium: 138 mmol/L (ref 135–145)

## 2024-04-17 LAB — CBC
HCT: 46.5 % (ref 39.0–52.0)
Hemoglobin: 13.9 g/dL (ref 13.0–17.0)
MCH: 27.1 pg (ref 26.0–34.0)
MCHC: 29.9 g/dL — ABNORMAL LOW (ref 30.0–36.0)
MCV: 90.8 fL (ref 80.0–100.0)
Platelets: 217 10*3/uL (ref 150–400)
RBC: 5.12 MIL/uL (ref 4.22–5.81)
RDW: 13.6 % (ref 11.5–15.5)
WBC: 5.9 10*3/uL (ref 4.0–10.5)
nRBC: 0 % (ref 0.0–0.2)

## 2024-04-17 LAB — HEPATIC FUNCTION PANEL
ALT: 253 U/L — ABNORMAL HIGH (ref 0–44)
AST: 84 U/L — ABNORMAL HIGH (ref 15–41)
Albumin: 3.9 g/dL (ref 3.5–5.0)
Alkaline Phosphatase: 127 U/L — ABNORMAL HIGH (ref 38–126)
Bilirubin, Direct: <0.1 mg/dL (ref 0.0–0.2)
Total Bilirubin: 0.8 mg/dL (ref 0.0–1.2)
Total Protein: 7.4 g/dL (ref 6.5–8.1)

## 2024-04-17 LAB — PHOSPHORUS: Phosphorus: 4.5 mg/dL (ref 2.5–4.6)

## 2024-04-17 LAB — HEPATITIS PANEL, ACUTE
HCV Ab: REACTIVE — AB
Hep A IgM: NONREACTIVE
Hep B C IgM: NONREACTIVE
Hepatitis B Surface Ag: NONREACTIVE

## 2024-04-17 LAB — MAGNESIUM: Magnesium: 2.1 mg/dL (ref 1.7–2.4)

## 2024-04-17 MED ORDER — FLUOXETINE HCL 20 MG PO CAPS
20.0000 mg | ORAL_CAPSULE | Freq: Every day | ORAL | Status: DC
  Administered 2024-04-18: 20 mg via ORAL
  Filled 2024-04-17: qty 1

## 2024-04-17 MED ORDER — ACETAMINOPHEN 325 MG PO TABS: 650.0000 mg | ORAL_TABLET | Freq: Four times a day (QID) | ORAL | Status: DC | PRN

## 2024-04-17 MED ORDER — ENOXAPARIN SODIUM 40 MG/0.4ML IJ SOSY
40.0000 mg | PREFILLED_SYRINGE | Freq: Every evening | INTRAMUSCULAR | Status: DC
  Administered 2024-04-17: 40 mg via SUBCUTANEOUS
  Filled 2024-04-17: qty 0.4

## 2024-04-17 MED ORDER — TRAZODONE HCL 100 MG PO TABS
100.0000 mg | ORAL_TABLET | Freq: Every evening | ORAL | Status: DC | PRN
  Administered 2024-04-17: 100 mg via ORAL
  Filled 2024-04-17: qty 1

## 2024-04-17 MED ORDER — POLYETHYLENE GLYCOL 3350 17 GM/SCOOP PO POWD
238.0000 g | Freq: Once | ORAL | Status: AC
  Administered 2024-04-17: 238 g via ORAL
  Filled 2024-04-17: qty 238

## 2024-04-17 NOTE — Progress Notes (Signed)
 William Macdonald requested to meet with a chaplain for spiritual support and prayer.  He has come to Christianity in recent years and is trying to live by his values.  He is grateful that the medication is helping him and feels hopeful about his upcoming time in rehab. He has a 22 year old daughter and he wants to be a good father.  He was asking for guidance because his own father was not there for him.  He wants to raise his daughter in [place] values.  She was removed from the home by CPS but he is hopeful that she will be able to return home. I provided listening as well as spiritual support and prayer.

## 2024-04-17 NOTE — Plan of Care (Signed)

## 2024-04-17 NOTE — Consult Note (Addendum)
 [facility] Psychiatric Consult Initial  Patient Name: .William Macdonald  MRN: [id number]  DOB: [birth year]  Consult Order details:  Orders (From admission, onward)     Start     Ordered   04/16/24 0448  IP CONSULT TO PSYCHIATRY       Ordering Provider: Bary Boss, DO  Provider:  (Not yet assigned)  Question Answer Comment  Location [facility]   Reason for Consult? Suicidal ideation and attempt by swallowing 11 AAA batteries.      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  PTSD 3.  Polysubstance use d/o, including opiates  Assessment  William Macdonald is a 23 y.o. male admitted: Medicallyfor 04/16/2024  2:57 AM for swallowing 11-AAA batteries and a piece of concrete paint in prison. He carries the psychiatric diagnoses of Stress reaction with mixed disturbance of of emotion and conduct, PTSD, and polysubstance use d/o, including opiates with a negative medical history.   His current presentation of impulsive, manipulative behavior, swallowing foreign objected, dysphoric mood, and worry is most consistent with stress reaction with mixed disturbance of emotion and conduct. 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  Current outpatient psychotropic medications include Remeron 15 mg at bedtime and historically he has had a positive response to these medications with restless legs side effects. He was compliant with medications prior to admission as evidenced  by self-report. On initial examination, patient was cooperative and pleasant with some physical discomfort, GI pain. Please see plan below for detailed recommendations.   Diagnoses:  Active Hospital problems: Principal Problem:   Swallowed foreign body, initial encounter Active Problems:   PTSD (post-traumatic stress disorder)   Polysubstance use disorder   Stress reaction causing mixed disturbance of emotion and conduct    Plan   ## Psychiatric Medication Recommendations:  -Increase Trazodone  100 mg at bedtime prn. -Discontinued Remeron 15 mg at bedtime due to restless legs -Continue Prozac  10 mg daily, increase to 20mg  po daily starting tomorrow.   ## Medical Decision Making Capacity: Not specifically addressed in this encounter  ## Further Work-up:  -- EKG ordered -- most recent EKG on 03/20/2024 had QtC of 507 after using cocaine and marijuana -- Pertinent labwork reviewed earlier this admission includes: CBC, Chem Panel, and upper endoscopy   ## Disposition:-- There are no psychiatric contraindications to discharge at this time  ## Behavioral / Environmental: - No specific recommendations at this time.  -DC suicide sitter   ## Safety and Observation Level:  - Based on my clinical evaluation, I estimate the patient to be at low risk of self harm in the current setting. - At this time, we recommend  routine. This decision is based on my review of the chart including patient's history and current presentation, interview of the patient, mental status examination, and consideration of suicide risk including evaluating suicidal ideation, plan, intent, suicidal or self-harm behaviors, risk factors, and protective factors. This judgment is  based on our ability to directly address suicide risk, implement suicide prevention strategies, and develop a safety plan while the patient is in the clinical setting. Please contact our team if there is a concern that risk level has changed.  CSSR  Risk Category:low risk  Suicide Risk Assessment: Patient has following modifiable risk factors for suicide: under treated depression , which we are addressing by changing medications. Patient has following non-modifiable or demographic risk factors for suicide: male gender, history of suicide attempt, history of self harm behavior, and psychiatric hospitalization Patient has the following protective factors against suicide: Access to outpatient mental health care and Supportive family  Thank you for this consult request. Recommendations have been communicated to the primary team.  We will sign off at this time.   Rella Cardinal, FNP       History of Present Illness  Relevant Aspects of [facility] Course:  Admitted on 04/16/2024 for swallowing 11-AAA batteries and a piece of concrete paint in prison.   Patient Report:  23 yo male admitted for swallowing 11-AAA batteries and a piece of concrete paint in prison. He carries the psychiatric diagnoses of Stress reaction with mixed disturbance of depression and anxiety, PTSD, and polysubstance use d/o, including opiates with a negative medical history. "When I'm depressed, angry, agitated or challenged, I (swallow things) to get out of situations."  He has a history of swallowing foreign objects since 2018 when he is imprisoned in a detention center as a youth or adult imprisonment.  In the ED, he stated this was a suicide attempt.  During the assessment, he reported it being a way to get out of things.  In 2018, he swallowed a paper clip prior to a detention search to prevent getting in trouble for contraband.  Other times, it was to avoid things in prison.  Denies current suicidal ideations and has a parole hearing this week to get out.  He plans on discharging to [facility] for substance abuse rehab.  On assessment, he was having some abdominal pain and awaiting for objects to pass.  In reference to his depression, "I would say moderate" and  then stated moderate to severe, no suicidal ideations.  Past history of cutting in his teen years, no recent cutting episodes.  Anxiety is moderate to severe with no panic attacks.  Depression and anxiety as an adolescent with one psychiatric admission to [facility] at [age].He does have trauma with nightmares regularly along with flashbacks.  Denied hallucinations, paranoia, and homicidal ideations.  No mania symptoms when not using substances.  Prior to prison, he was "using anything I could get my hands on".  Typically, he used marijuana daily, meth occasionally, cocaine frequently, and IV Fentanyl .  Denies cravings or withdrawal symptoms.  Appetite was "normal" prior to swallowing the objects.  Sleep is fair with Remeron but complained of restless leg syndrome, changed to Trazodone .    He reported his mother is supportive.  He completed 8th grade, special education classes.  "My reading's pretty good, I understand what's right and wrong," volunteered this information.  He does have a child and was living in [facility], homeless prior to incarceration.    On report today he reports his mood is good and he is doing well. He denies any acute concerns. Reports sleeping but would like to increase the medicine to see if it will help him sleep better. He is eating. He continues to deny suicidal intent as it pertains to swallowing of the  batteries.   Psych ROS:  Depression: moderate Anxiety:  moderate Mania (lifetime and current): none Psychosis: (lifetime and current): past on substances, typically meth  Review of Systems  Constitutional: Negative.   HENT: Negative.    Eyes: Negative.   Respiratory: Negative.    Cardiovascular: Negative.   Gastrointestinal:  Negative for abdominal pain and nausea.  Genitourinary: Negative.   Musculoskeletal: Negative.   Skin: Negative.   Neurological: Negative.   Endo/Heme/Allergies: Negative.   Psychiatric/Behavioral:  Negative for depression. The  patient has insomnia. The patient is not nervous/anxious.   All other systems reviewed and are negative.    Psychiatric and Social History  Psychiatric History:  Information collected from patient and chart.  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 Statistician at bedside.  Vital Signs:  Temp:  [97.7 F (36.5 C)-98.5 F (36.9 C)] 97.8 F (36.6 C) (04/21 1306) Pulse Rate:  [60-85] 85 (04/21 1306) Resp:  [17-18] 17 (04/21 1306) BP: (111-116)/(69-73) 114/73 (04/21 1306) SpO2:  [98 %-100 %] 100 % (04/21 1306) Blood pressure 114/73, pulse 85, temperature 97.8 F (36.6 C), resp. rate 17, height 5\' 8"  (1.727 m), weight 65.7 kg, SpO2 100%. Body mass index is 22.02 kg/m.  Physical Exam Vitals and nursing note reviewed.  Constitutional:      Appearance: Normal appearance.  HENT:     Head: Normocephalic.      Nose: Nose normal.  Pulmonary:     Effort: Pulmonary effort is normal.  Musculoskeletal:     Cervical back: Normal range of motion.  Neurological:     General: No focal deficit present.     Mental Status: He is alert and oriented to person, place, and time.  Psychiatric:        Mood and Affect: Mood normal.        Behavior: Behavior normal.        Thought Content: Thought content normal.        Judgment: Judgment normal.     Mental Status Exam: General Appearance: Casual  Orientation:  Full (Time, Place, and Person)  Memory:  Immediate;   Good Recent;   Good Remote;   Good  Concentration:  Concentration: Good and Attention Span: Good  Recall:  Good  Attention  Good  Eye Contact:  Fair  Speech:  Normal Rate  Language:  Good  Volume:  Normal  Mood: depressed and anxious  Affect:  Congruent  Thought Process:  Coherent  Thought Content:  Logical  Suicidal Thoughts:  No  Homicidal Thoughts:  No  Judgement:  Fair  Insight:  Fair  Psychomotor Activity:  Decreased  Akathisia:  No  Fund of Knowledge:  Fair      Assets:  Housing Leisure Time Physical Health Resilience  Cognition:  WNL  ADL's:  Intact  AIMS (if indicated):        Other History   These have been pulled in through the EMR, reviewed, and updated if appropriate.  Family History:  The patient's family history is not on file.  Medical History: Past Medical History:  Diagnosis Date   Anxiety     Surgical History: Past Surgical History:  Procedure Laterality Date   ESOPHAGOGASTRODUODENOSCOPY N/A 01/06/2024   Procedure: ESOPHAGOGASTRODUODENOSCOPY (EGD);  Surgeon: Normie Becton., MD;  Location: [facility] ENDOSCOPY;  Service: Gastroenterology;  Laterality: N/A;   FOREIGN BODY REMOVAL  01/06/2024   Procedure: FOREIGN BODY REMOVAL;  Surgeon: Normie Becton., MD;  Location: [facility] ENDOSCOPY;  Service: Gastroenterology;;   TONSILLECTOMY       Medications:   Current Facility-Administered Medications:     acetaminophen  (TYLENOL ) tablet 650 mg, 650 mg, Oral, Q6H PRN, Althia Atlas, MD   enoxaparin  (LOVENOX ) injection 40 mg, 40 mg, Subcutaneous, QPM, Althia Atlas, MD   FLUoxetine  (PROZAC ) capsule 10 mg, 10 mg, Oral, Daily, Lord, Jamison Y, NP, 10 mg at 04/17/24 1006   pantoprazole  (PROTONIX ) EC tablet 40 mg, 40 mg, Oral, BID, Althia Atlas, MD, 40 mg at 04/17/24 1006   prochlorperazine  (COMPAZINE ) injection 5 mg, 5 mg, Intravenous, Q6H PRN, Hall, Carole N, DO   traZODone  (DESYREL ) tablet 50 mg, 50 mg, Oral, QHS, Lissa Riding, NP, 50 mg at 04/16/24 2207  Allergies: Allergies  Allergen Reactions   Zyprexa [Olanzapine] Other (See Comments)    Muscle spams   Trileptal [Oxcarbazepine]     Leg spasms and muscle tension    Rella Cardinal, FNP

## 2024-04-17 NOTE — Progress Notes (Signed)
   04/17/24 1329  TOC Brief Assessment  Insurance and Status Reviewed  Patient has primary care physician Yes  Home environment has been reviewed [facility]  Prior level of function: Independent  Prior/Current Home Services No current home services  Social Drivers of Health Review SDOH reviewed no interventions necessary  Readmission risk has been reviewed Yes  Transition of care needs no transition of care needs at this time    [place], LCSW 04/17/2024 1:30 PM

## 2024-04-17 NOTE — Progress Notes (Signed)
 Subjective: Some abdominal cramping after bowel prep. Stool/gas output with bowel prep.  Objective: Vital signs in last 24 hours: Temp:  [97.7 F (36.5 C)-98.5 F (36.9 C)] 97.7 F (36.5 C) (04/21 0500) Pulse Rate:  [60-71] 60 (04/21 0500) Resp:  [18] 18 (04/21 0500) BP: (111-116)/(69-70) 111/70 (04/21 0500) SpO2:  [98 %-100 %] 98 % (04/21 0500) Weight change:  Last BM Date : 04/16/24  PE: GEN:  NAD ABD:  Soft, non-tender, non-distended, no peritonitis  Lab Results: CBC    Component Value Date/Time   WBC 5.9 04/17/2024 0518   RBC 5.12 04/17/2024 0518   HGB 13.9 04/17/2024 0518   HCT 46.5 04/17/2024 0518   PLT 217 04/17/2024 0518   MCV 90.8 04/17/2024 0518   MCH 27.1 04/17/2024 0518   MCHC 29.9 (L) 04/17/2024 0518   RDW 13.6 04/17/2024 0518   LYMPHSABS 1.9 03/03/2024 1937   MONOABS 0.8 03/03/2024 1937   EOSABS 0.1 03/03/2024 1937   BASOSABS 0.0 03/03/2024 1937  CMP     Component Value Date/Time   NA 138 04/17/2024 0518   K 4.6 04/17/2024 0518   CL 102 04/17/2024 0518   CO2 29 04/17/2024 0518   GLUCOSE 92 04/17/2024 0518   BUN 9 04/17/2024 0518   CREATININE 0.76 04/17/2024 0518   CALCIUM 9.6 04/17/2024 0518   PROT 7.4 04/17/2024 0518   ALBUMIN 3.9 04/17/2024 0518   AST 84 (H) 04/17/2024 0518   ALT 253 (H) 04/17/2024 0518   ALKPHOS 127 (H) 04/17/2024 0518   BILITOT 0.8 04/17/2024 0518   GFRNONAA >60 04/17/2024 0518   Xray:  Personally reviewed, cluster of 10+ batteries in ascending colon (stomach yesterday)  Assessment:   Foreign body ingestion:  AAA batteries. Elevated LFTs.  Plan:   Full liquid diet ok. Gatorade/miralax  prep today. Repeat abdominal xray tomorrow. Acute hepatitis panel. [facility] will follow.  Patient needs to stay in hospital until all the batteries have passed.   Yves Herb 04/17/2024, 11:00 AM   Cell [phone] If no answer or after 5 PM call [phone]

## 2024-04-17 NOTE — Plan of Care (Signed)
  Problem: Health Behavior/Discharge Planning: Goal: Ability to manage health-related needs will improve 04/17/2024 1038 by Lenice Quill, RN Outcome: Progressing 04/17/2024 0858 by Lenice Quill, RN Outcome: Progressing   Problem: Nutrition: Goal: Adequate nutrition will be maintained 04/17/2024 1038 by Lenice Quill, RN Outcome: Progressing 04/17/2024 0858 by Lenice Quill, RN Outcome: Progressing   Problem: Elimination: Goal: Will not experience complications related to bowel motility 04/17/2024 1038 by Lenice Quill, RN Outcome: Progressing 04/17/2024 0858 by Lenice Quill, RN Outcome: Progressing   Problem: Elimination: Goal: Will not experience complications related to urinary retention 04/17/2024 1038 by Lenice Quill, RN Outcome: Progressing 04/17/2024 0858 by Lenice Quill, RN Outcome: Progressing   Problem: Pain Managment: Goal: General experience of comfort will improve and/or be controlled 04/17/2024 1038 by Lenice Quill, RN Outcome: Progressing 04/17/2024 0858 by Lenice Quill, RN Outcome: Progressing

## 2024-04-17 NOTE — Progress Notes (Signed)
 [facility] Progress Note  Patient: William Macdonald    ZOX:[id number]  DOA: 04/16/2024     Date of Service: the patient was seen and examined on 04/17/2024  Chief Complaint  Patient presents with   Swallowed Foreign Body   Brief hospital course: William Macdonald is a 23 y.o. male with medical history significant for major depressive disorder, suicidal ideation, who presents to the ER under [facility] custody after swallowing 11 AAA batteries before midnight.  Associated with left upper quadrant abdominal pain.  Denies nausea, vomiting, subjective fevers, or chills.  The patient has a history of the same that had previously required EGD for removal of foreign body swallowed.  He endorses wanting to kill himself due to his incarceration.   EDP discussed the case with GI on-call who recommended medical management with laxative since AAA batteries can pass through the GI tract.  Lab studies are notable for uptrending elevated LFTs when compared to previous lab work done on 03/20/2024.  [facility], hospitalist service, was asked to admit for Intraven passing of the batteries prior to return to the detention facility.   ED Course: Temperature 97.9.  BP 123/81, pulse 68, respiratory rate 17, O2 saturation 100% on room air.  Lab studies notable for alkaline phosphatase 141, AST 165, ALT 368.   Assessment and Plan:  # Swallowed foreign body, initial encounter The patient endorses swallowing 11 AAA batteries as a suicide attempt History of the same previously foreign body was removed via EGD GI recommended medical management with laxative to induce removal of the batteries through the GI tract. 4/20 started GoLytely  4/21 abdominal x-ray shows blood trace in the ascending colon GI recommended, Gatorade/miralax  prep today.  Repeat x-ray tomorrow a.m. Patient needs to stay in hospital until all the batteries have passed.  Continue supportive care IV fluid hydration   # Elevated liver  chemistries, uptrending Alkaline phosphatase 141 from 136 from almost 1 month ago AST 68-->165--.84 ALT 58-->368-->253 Repeat chemistry panel Avoid hepatotoxic agents Follow acute hepatitis panel  Psychiatric illness Major depressive disorder With suicidal ideation and attempt Psychiatry consulted    Body mass index is 22.02 kg/m.  Interventions:  Diet: Full liquid diet DVT Prophylaxis: Subcutaneous Lovenox    Advance goals of care discussion: Full code  Family Communication: family was not present at bedside, at the time of interview.  The pt provided permission to discuss medical plan with the family. Opportunity was given to ask question and all questions were answered satisfactorily.   Disposition:  Pt is under [facility] custody, admitted with ingestion of AAA batteries, still has batteries in the colon and he is on full liquid diet, which precludes a safe discharge. Discharge to go for clinical study, when stable, most likely tomorrow a.m.  Subjective: No significant events overnight, patient is moving bowels after laxatives, abdominal pain is well-controlled.  Denies any nausea vomiting.  Patient is hungry and would like to advance diet but GI recommended full liquid diet, no solid food yet.   Physical Exam: General: NAD, lying comfortably Appear in no distress, affect appropriate Eyes: PERRLA ENT: Oral Mucosa Clear, moist  Neck: no JVD,  Cardiovascular: S1 and S2 Present, no Murmur,  Respiratory: good respiratory effort, Bilateral Air entry equal and Decreased, no Crackles, no wheezes Abdomen: Bowel Sound present, Soft and mild lower abd tenderness,  Skin: no rashes Extremities: no Pedal edema, no calf tenderness Neurologic: without any new focal findings Gait not checked due to patient safety concerns  Vitals:  04/16/24 0828 04/16/24 2038 04/17/24 0500 04/17/24 1306  BP: 119/66 116/69 111/70 114/73  Pulse: 71 71 60 85  Resp: 18 18 18 17   Temp: (!)  97.5 F (36.4 C) 98.5 F (36.9 C) 97.7 F (36.5 C) 97.8 F (36.6 C)  TempSrc: Oral Oral Oral   SpO2: 100% 100% 98% 100%  Weight:      Height:        Intake/Output Summary (Last 24 hours) at 04/17/2024 1343 Last data filed at 04/17/2024 1247 Gross per 24 hour  Intake 3970 ml  Output 1475 ml  Net 2495 ml   Filed Weights   04/16/24 0334  Weight: 65.7 kg    Data Reviewed: I have personally reviewed and interpreted daily labs, tele strips, imagings as discussed above. I reviewed all nursing notes, pharmacy notes, vitals, pertinent old records I have discussed plan of care as described above with RN and patient/family.  CBC: Recent Labs  Lab 04/16/24 0314 04/17/24 0518  WBC 7.1 5.9  HGB 14.6 13.9  HCT 47.6 46.5  MCV 88.6 90.8  PLT 207 217   Basic Metabolic Panel: Recent Labs  Lab 04/16/24 0314 04/16/24 0541 04/17/24 0518  NA 137  --  138  K 3.9  --  4.6  CL 100  --  102  CO2 28  --  29  GLUCOSE 82  --  92  BUN 12  --  9  CREATININE 0.56*  --  0.76  CALCIUM 9.3  --  9.6  MG  --  2.3 2.1  PHOS  --  4.4 4.5    Studies: DG Abd 2 Views Result Date: 04/17/2024 CLINICAL DATA:  Ingested batteries. EXAM: ABDOMEN - 2 VIEW COMPARISON:  KUB 04/16/2024 at 3:06 a.m. FINDINGS: 4:55 a.m. A loose cluster of 10 AAA batteries, previously in the stomach is now in the distal ascending colon. The bowel pattern is nonobstructive. There is no supine evidence of free air or other significant findings. Lung bases are clear. IMPRESSION: A loose cluster of 10 AAA batteries, previously in the stomach is now in the distal ascending colon. Electronically Signed   By: Denman Fischer M.D.   On: 04/17/2024 06:51    Scheduled Meds:  FLUoxetine   10 mg Oral Daily   pantoprazole   40 mg Oral BID   traZODone   50 mg Oral QHS   Continuous Infusions: PRN Meds: acetaminophen , prochlorperazine   Time spent: 55 minutes  Author: Althia Atlas. MD [facility] 04/17/2024 1:43 PM  To reach  On-call, see care teams to locate the attending and reach out to them via [email or web address]. If 7PM-7AM, please contact night-coverage If you still have difficulty reaching the attending provider, please page the [facility] (Director on Call) for [facility] on amion for assistance.

## 2024-04-18 ENCOUNTER — Other Ambulatory Visit (HOSPITAL_BASED_OUTPATIENT_CLINIC_OR_DEPARTMENT_OTHER): Payer: Self-pay

## 2024-04-18 ENCOUNTER — Other Ambulatory Visit (HOSPITAL_COMMUNITY): Payer: Self-pay

## 2024-04-18 ENCOUNTER — Inpatient Hospital Stay (HOSPITAL_COMMUNITY)

## 2024-04-18 DIAGNOSIS — T189XXA Foreign body of alimentary tract, part unspecified, initial encounter: Secondary | ICD-10-CM | POA: Diagnosis not present

## 2024-04-18 LAB — HEPATIC FUNCTION PANEL
ALT: 196 U/L — ABNORMAL HIGH (ref 0–44)
AST: 56 U/L — ABNORMAL HIGH (ref 15–41)
Albumin: 3.7 g/dL (ref 3.5–5.0)
Alkaline Phosphatase: 126 U/L (ref 38–126)
Bilirubin, Direct: <0.1 mg/dL (ref 0.0–0.2)
Total Bilirubin: 0.6 mg/dL (ref 0.0–1.2)
Total Protein: 7.1 g/dL (ref 6.5–8.1)

## 2024-04-18 LAB — MAGNESIUM: Magnesium: 2.1 mg/dL (ref 1.7–2.4)

## 2024-04-18 LAB — BASIC METABOLIC PANEL WITH GFR
Anion gap: 10 (ref 5–15)
BUN: 9 mg/dL (ref 6–20)
CO2: 26 mmol/L (ref 22–32)
Calcium: 9.2 mg/dL (ref 8.9–10.3)
Chloride: 97 mmol/L — ABNORMAL LOW (ref 98–111)
Creatinine, Ser: 0.82 mg/dL (ref 0.61–1.24)
GFR, Estimated: >60 mL/min (ref >60)
Glucose, Bld: 153 mg/dL — ABNORMAL HIGH (ref 70–99)
Potassium: 4.2 mmol/L (ref 3.5–5.1)
Sodium: 133 mmol/L — ABNORMAL LOW (ref 135–145)

## 2024-04-18 LAB — CBC
HCT: 44.3 % (ref 39.0–52.0)
Hemoglobin: 13.4 g/dL (ref 13.0–17.0)
MCH: 27.5 pg (ref 26.0–34.0)
MCHC: 30.2 g/dL (ref 30.0–36.0)
MCV: 90.8 fL (ref 80.0–100.0)
Platelets: 202 10*3/uL (ref 150–400)
RBC: 4.88 MIL/uL (ref 4.22–5.81)
RDW: 13.6 % (ref 11.5–15.5)
WBC: 6.2 10*3/uL (ref 4.0–10.5)
nRBC: 0 % (ref 0.0–0.2)

## 2024-04-18 LAB — PHOSPHORUS: Phosphorus: 4.5 mg/dL (ref 2.5–4.6)

## 2024-04-18 MED ORDER — FLUOXETINE HCL 20 MG PO CAPS: 20.0000 mg | ORAL_CAPSULE | Freq: Every day | ORAL | 11 refills | Status: AC

## 2024-04-18 MED ORDER — POLYETHYLENE GLYCOL 3350 17 G PO PACK
17.0000 g | PACK | Freq: Two times a day (BID) | ORAL | Status: DC
  Administered 2024-04-18: 17 g via ORAL
  Filled 2024-04-18: qty 1

## 2024-04-18 MED ORDER — TRAZODONE HCL 100 MG PO TABS
100.0000 mg | ORAL_TABLET | Freq: Every evening | ORAL | 0 refills | Status: DC | PRN
  Filled 2024-04-18: qty 30, 30d supply, fill #0

## 2024-04-18 MED ORDER — PANTOPRAZOLE SODIUM 40 MG PO TBEC: 40.0000 mg | DELAYED_RELEASE_TABLET | Freq: Every day | ORAL | 0 refills | Status: AC

## 2024-04-18 MED ORDER — FLUOXETINE HCL 20 MG PO CAPS
20.0000 mg | ORAL_CAPSULE | Freq: Every day | ORAL | 11 refills | Status: DC
Start: 2024-04-19 — End: 2024-04-18
  Filled 2024-04-18: qty 30, 30d supply, fill #0

## 2024-04-18 MED ORDER — PANTOPRAZOLE SODIUM 40 MG PO TBEC
40.0000 mg | DELAYED_RELEASE_TABLET | Freq: Every day | ORAL | 0 refills | Status: DC
  Filled 2024-04-18: qty 7, 7d supply, fill #0

## 2024-04-18 MED ORDER — TRAZODONE HCL 100 MG PO TABS: 100.0000 mg | ORAL_TABLET | Freq: Every evening | ORAL | 0 refills | Status: AC | PRN

## 2024-04-18 MED ORDER — POLYETHYLENE GLYCOL 3350 17 GM/SCOOP PO POWD: 17.0000 g | Freq: Two times a day (BID) | ORAL | 0 refills | Status: AC

## 2024-04-18 MED ORDER — POLYETHYLENE GLYCOL 3350 17 GM/SCOOP PO POWD
17.0000 g | Freq: Two times a day (BID) | ORAL | 0 refills | Status: DC
  Filled 2024-04-18: qty 238, 7d supply, fill #0

## 2024-04-18 NOTE — Progress Notes (Addendum)
 William Macdonald 9:07 AM  Subjective: Patient doing fine wants to eat no abdominal pain has not seen any batteries in his stools no new complaints and his case discussed with the hospital doctors as well as my partner Dr. Kimble Pennant and his hospital computer chart and x-rays reviewed  Objective: Vital signs stable afebrile no acute distress abdomen is soft nontender x-ray reviewed batteries slowly moving through:  Assessment: Battery ingestion all in  Plan: I actually disagree with Dr. Kimble Pennant and I think he will be fine for outpatient management and can be return to the jail and I think he can eat regular food and he will need an outpatient x-ray in a week or 2 and the only time we would urgently need to get the batteries out is if he needed an MRI for a another reason or if they would not pass over a month or he was having obvious symptoms from them and based on my conversation with the officers when you send him back I have recommended double portions of food and twice daily MiraLAX  and then they can continue that when he goes to his rehab facility and follow-up with Dr. Kimble Pennant as an outpatient in 1 to 2 weeks and last follow-up x-ray shows everything has passed and please call me if I could be of any further assistance with this hospital stay [facility]  office [phone] After 5PM or if no answer call [phone]

## 2024-04-18 NOTE — Plan of Care (Signed)
 Patient ID: William Macdonald, male   DOB: 2001/11/02, 23 y.o.   MRN: [id number]  Problem: Education: Goal: Knowledge of General Education information will improve Description: Including pain rating scale, medication(s)/side effects and non-pharmacologic comfort measures Outcome: Adequate for Discharge   Problem: Health Behavior/Discharge Planning: Goal: Ability to manage health-related needs will improve Outcome: Adequate for Discharge   Problem: Clinical Measurements: Goal: Ability to maintain clinical measurements within normal limits will improve Outcome: Adequate for Discharge Goal: Will remain free from infection Outcome: Adequate for Discharge Goal: Diagnostic test results will improve Outcome: Adequate for Discharge Goal: Respiratory complications will improve Outcome: Adequate for Discharge Goal: Cardiovascular complication will be avoided Outcome: Adequate for Discharge   Problem: Activity: Goal: Risk for activity intolerance will decrease Outcome: Adequate for Discharge   Problem: Nutrition: Goal: Adequate nutrition will be maintained Outcome: Adequate for Discharge   Problem: Coping: Goal: Level of anxiety will decrease Outcome: Adequate for Discharge   Problem: Elimination: Goal: Will not experience complications related to bowel motility Outcome: Adequate for Discharge Goal: Will not experience complications related to urinary retention Outcome: Adequate for Discharge   Problem: Pain Managment: Goal: General experience of comfort will improve and/or be controlled Outcome: Adequate for Discharge   Problem: Safety: Goal: Ability to remain free from injury will improve Outcome: Adequate for Discharge   Problem: Skin Integrity: Goal: Risk for impaired skin integrity will decrease Outcome: Adequate for Discharge    Genella Kendall, RN

## 2024-04-18 NOTE — Discharge Summary (Signed)
 [facility] Discharge Summary   Patient: William Macdonald GNF:[id number]  PCP: Patient, No Pcp Per  Date of admission: 04/16/2024   Date of discharge:  04/18/2024     Discharge Diagnoses:  Principal Problem:   Swallowed foreign body, initial encounter Active Problems:   PTSD (post-traumatic stress disorder)   Polysubstance use disorder   Stress reaction causing mixed disturbance of emotion and conduct   Admitted From: Community, patient is under [facility] custody  Disposition: Back to [facility]   Recommendations for Outpatient Follow-up:  Follow-up with PCP in 1 week, repeat abdominal x-ray in 1 to 2 weeks.  Can stop MiraLAX  for he passes all the batteries Follow-up with GI in 1 to 2 weeks Follow-up with psych in 1 to 2 weeks Follow up LABS/TEST: CMP in 1 to 2 weeks and abdominal x-ray in 1 to 2 weeks   Follow-up Information     PCP Follow up in 1 week(s).          Evangeline Hilts, MD Follow up in 1 week(s).   Specialty: Gastroenterology Contact information: [address] [place] [address] [phone]                Diet recommendation: Regular diet  Activity: The patient is advised to gradually reintroduce usual activities, as tolerated  Discharge Condition: stable  Code Status: Full code   History of present illness: As per the H and P dictated on admission Hospital Course:  # Swallowed foreign body, initial encounter The patient endorses swallowing 11 AAA batteries as a suicide attempt History of the same previously foreign body was removed via EGD GI recommended medical management with laxative to induce removal of the batteries through the GI tract. 4/20 started GoLytely  4/21 abdominal x-ray shows blood trace in the ascending colon GI recommended, Gatorade/miralax  prep.  Repeat x-ray tomorrow a.m. Patient needs to stay in hospital until all the batteries have passed.  4/22 abdominal x-ray reviewed, batteries  moved to left side of the colon and some Orrester on the right side. Patient moved bowels and RN informed that patient passed 10 batteries out which were thrown in the biohazard. Patient was seen by GI, recommended to advance diet and discharge, follow-up x-ray in 1 to 2 weeks as an outpatient and continue MiraLAX  twice daily.  Patient remained asymptomatic and cleared for discharge. Continue PPI for GI prophylaxis   # Elevated liver chemistries, uptrending Alkaline phosphatase 141 from 136 from almost 1 month ago AST 68-->165--.84--56 ALT 58-->368-->253--196 LFTs gradually improving, patient denies any complaints.  Follow with PCP and repeat CMP after 1 to 2 weeks.    Psychiatric illness Major depressive disorder With suicidal ideation and attempt Psychiatry consulted.  Currently patient does not have any SI and HI, cleared by psych to discharge. Psych recommended Prozac  20 mg p.o. daily and trazodone  100 mg p.o. nightly as needed for sleep.   Discontinued Abilify , Remeron, risperidone , Zoloft  listed home medications.    Body mass index is 22.02 kg/m.  Nutrition Interventions:   - Patient was instructed, not to drive, operate heavy machinery, perform activities at heights, swimming or participation in water activities or provide baby sitting services while on Pain, Sleep and Anxiety Medications; until his outpatient Physician has advised to do so again.  - Also recommended to not to take more than prescribed Pain, Sleep and Anxiety Medications.  Patient was ambulatory without any assistance. On the day of the discharge the patient's vitals were stable, and  no other acute medical condition were reported by patient. the patient was felt safe to be discharged back to law enforcement.  Consultants: GI and psych Procedures: None  Discharge Exam: General: Appear in no distress, no Rash; Oral Mucosa Clear, moist. Cardiovascular: S1 and S2 Present, no Murmur, Respiratory: normal  respiratory effort, Bilateral Air entry present and no Crackles, no wheezes Abdomen: Bowel Sound present, Soft and no tenderness, no hernia Extremities: no Pedal edema, no calf tenderness Neurology: alert and oriented to time, place, and person affect appropriate.  Filed Weights   04/16/24 0334  Weight: 65.7 kg   Vitals:   04/18/24 0543 04/18/24 1347  BP: 111/62 118/72  Pulse: 68 78  Resp: 18 16  Temp: 97.7 F (36.5 C) (!) 97.3 F (36.3 C)  SpO2: 100% 99%    DISCHARGE MEDICATION: Allergies as of 04/18/2024       Reactions   Zyprexa [olanzapine] Other (See Comments)   Muscle spams   Trileptal [oxcarbazepine]    Leg spasms and muscle tension        Medication List     STOP taking these medications    Acetaminophen  Extra Strength 500 MG Tabs   ARIPiprazole  10 MG tablet Commonly known as: Abilify    FT Antibiotic ointment Generic drug: bacitracin    mirtazapine 15 MG tablet Commonly known as: REMERON   risperiDONE  0.5 MG tablet Commonly known as: RISPERDAL    sertraline  50 MG tablet Commonly known as: ZOLOFT    terbinafine 1 % cream Commonly known as: LAMISIL       TAKE these medications    FLUoxetine  20 MG capsule Commonly known as: PROZAC  Take 1 capsule (20 mg total) by mouth daily. Start taking on: April 19, 2024   pantoprazole  40 MG tablet Commonly known as: Protonix  Take 1 tablet (40 mg total) by mouth daily for 7 days.   polyethylene glycol 17 g packet Commonly known as: MIRALAX  / GLYCOLAX  Take 17 g by mouth 2 (two) times daily.   traZODone  100 MG tablet Commonly known as: DESYREL  Take 1 tablet (100 mg total) by mouth at bedtime as needed for sleep.       Allergies  Allergen Reactions   Zyprexa [Olanzapine] Other (See Comments)    Muscle spams   Trileptal [Oxcarbazepine]     Leg spasms and muscle tension   Discharge Instructions     Call MD for:  difficulty breathing, headache or visual disturbances   Complete by: As directed     Call MD for:  extreme fatigue   Complete by: As directed    Call MD for:  persistant dizziness or light-headedness   Complete by: As directed    Call MD for:  persistant nausea and vomiting   Complete by: As directed    Call MD for:  severe uncontrolled pain   Complete by: As directed    Diet general   Complete by: As directed    Can eat double portions of he is hungry   Discharge instructions   Complete by: As directed    Follow-up with PCP in 1 week, repeat abdominal x-ray in 1 to 2 weeks.  Can stop MiraLAX  for he passes all the batteries Follow-up with GI in 1 to 2 weeks Follow-up with psych in 1 to 2 weeks   Increase activity slowly   Complete by: As directed        The results of significant diagnostics from this hospitalization (including imaging, microbiology, ancillary and laboratory) are listed below for  reference.    Significant Diagnostic Studies: DG Abd 2 Views Result Date: 04/18/2024 CLINICAL DATA:  History of swallowing batteries, follow-up exam EXAM: ABDOMEN - 2 VIEW COMPARISON:  Film from the previous day. FINDINGS: There again noted 11 metallic foreign bodies consistent with ingested batteries. Two of these lie within the ascending colon. One lies in the distal transverse colon. Seven lie in the mid descending colon with 1 in the region of the rectum. No free air is noted. No obstructive changes seen. IMPRESSION: Scattered ingested batteries throughout the colon. Electronically Signed   By: Violeta Grey M.D.   On: 04/18/2024 09:47   DG Abd 2 Views Result Date: 04/17/2024 CLINICAL DATA:  Ingested batteries. EXAM: ABDOMEN - 2 VIEW COMPARISON:  KUB 04/16/2024 at 3:06 a.m. FINDINGS: 4:55 a.m. A loose cluster of 10 AAA batteries, previously in the stomach is now in the distal ascending colon. The bowel pattern is nonobstructive. There is no supine evidence of free air or other significant findings. Lung bases are clear. IMPRESSION: A loose cluster of 10 AAA batteries,  previously in the stomach is now in the distal ascending colon. Electronically Signed   By: Denman Fischer M.D.   On: 04/17/2024 06:51   DG Abdomen 1 View Result Date: 04/16/2024 CLINICAL DATA:  23 year old male with ingestion of batteries. EXAM: ABDOMEN - 1 VIEW COMPARISON:  Abdominal radiographs 01/06/2024. FINDINGS: Portable AP supine view at 0306 hours. Numerous, approximately 10 cylindrical battery type foreign bodies (such as AA or AAA battery type) project in the epigastrium over the gastric air. Three similar foreign bodies depicted elsewhere in the abdomen in January are no longer identified. Large bowel retained stool. Non obstructed bowel gas pattern. Negative lung bases. Negative visible osseous structures. IMPRESSION: Numerous, approximately 10 ingested batteries project in the stomach. Non obstructed bowel gas pattern. Electronically Signed   By: Marlise Simpers M.D.   On: 04/16/2024 04:15    Microbiology: No results found for this or any previous visit (from the past 240 hours).   Labs: CBC: Recent Labs  Lab 04/16/24 0314 04/17/24 0518 04/18/24 0542  WBC 7.1 5.9 6.2  HGB 14.6 13.9 13.4  HCT 47.6 46.5 44.3  MCV 88.6 90.8 90.8  PLT 207 217 202   Basic Metabolic Panel: Recent Labs  Lab 04/16/24 0314 04/16/24 0541 04/17/24 0518 04/18/24 0542  NA 137  --  138 133*  K 3.9  --  4.6 4.2  CL 100  --  102 97*  CO2 28  --  29 26  GLUCOSE 82  --  92 153*  BUN 12  --  9 9  CREATININE 0.56*  --  0.76 0.82  CALCIUM 9.3  --  9.6 9.2  MG  --  2.3 2.1 2.1  PHOS  --  4.4 4.5 4.5   Liver Function Tests: Recent Labs  Lab 04/16/24 0314 04/17/24 0518 04/18/24 0542  AST 165* 84* 56*  ALT 368* 253* 196*  ALKPHOS 141* 127* 126  BILITOT 0.7 0.8 0.6  PROT 8.6* 7.4 7.1  ALBUMIN 4.4 3.9 3.7   No results for input(s): "LIPASE", "AMYLASE" in the last 168 hours. No results for input(s): "AMMONIA" in the last 168 hours. Cardiac Enzymes: No results for input(s): "CKTOTAL", "CKMB",  "CKMBINDEX", "TROPONINI" in the last 168 hours. BNP (last 3 results) No results for input(s): "BNP" in the last 8760 hours. CBG: No results for input(s): "GLUCAP" in the last 168 hours.  Time spent: 35 minutes  Signed:  Mintie Witherington  Hubert Madden  [facility] 04/18/2024 3:30 PM

## 2024-04-18 NOTE — Progress Notes (Signed)
 Patient ID: SQUARE JOWETT, male   DOB: Apr 03, 2001, 23 y.o.   MRN: [id number] Patient had BM, 10 batteries found in stool, placed in biohazard bag. MD notified. Patient discharged. Batteries left in patient room. Batteries disposed of.  Genella Kendall, RN

## 2024-04-26 ENCOUNTER — Emergency Department (HOSPITAL_COMMUNITY)

## 2024-04-26 ENCOUNTER — Other Ambulatory Visit: Payer: Self-pay

## 2024-04-26 ENCOUNTER — Encounter (HOSPITAL_COMMUNITY): Payer: Self-pay

## 2024-04-26 ENCOUNTER — Emergency Department (HOSPITAL_COMMUNITY)
Admission: EM | Admit: 2024-04-26 | Discharge: 2024-04-26 | Disposition: A | Discharge status: Home / Self Care | Attending: Emergency Medicine | Admitting: Emergency Medicine

## 2024-04-26 DIAGNOSIS — R451 Restlessness and agitation: Secondary | ICD-10-CM | POA: Diagnosis present

## 2024-04-26 DIAGNOSIS — R7989 Other specified abnormal findings of blood chemistry: Secondary | ICD-10-CM

## 2024-04-26 DIAGNOSIS — R945 Abnormal results of liver function studies: Secondary | ICD-10-CM | POA: Insufficient documentation

## 2024-04-26 DIAGNOSIS — F1910 Other psychoactive substance abuse, uncomplicated: Secondary | ICD-10-CM | POA: Insufficient documentation

## 2024-04-26 LAB — CBC WITH DIFFERENTIAL/PLATELET
Abs Immature Granulocytes: 0.13 10*3/uL — ABNORMAL HIGH (ref 0.00–0.07)
Basophils Absolute: 0 10*3/uL (ref 0.0–0.1)
Basophils Relative: 0 %
Eosinophils Absolute: 0.1 10*3/uL (ref 0.0–0.5)
Eosinophils Relative: 0 %
HCT: 40.8 % (ref 39.0–52.0)
Hemoglobin: 13.3 g/dL (ref 13.0–17.0)
Immature Granulocytes: 1 %
Lymphocytes Relative: 10 %
Lymphs Abs: 1.8 10*3/uL (ref 0.7–4.0)
MCH: 27.7 pg (ref 26.0–34.0)
MCHC: 32.6 g/dL (ref 30.0–36.0)
MCV: 84.8 fL (ref 80.0–100.0)
Monocytes Absolute: 1.1 10*3/uL — ABNORMAL HIGH (ref 0.1–1.0)
Monocytes Relative: 6 %
Neutro Abs: 15.3 10*3/uL — ABNORMAL HIGH (ref 1.7–7.7)
Neutrophils Relative %: 83 %
Platelets: 207 10*3/uL (ref 150–400)
RBC: 4.81 MIL/uL (ref 4.22–5.81)
RDW: 14 % (ref 11.5–15.5)
WBC: 18.4 10*3/uL — ABNORMAL HIGH (ref 4.0–10.5)
nRBC: 0 % (ref 0.0–0.2)

## 2024-04-26 LAB — COMPREHENSIVE METABOLIC PANEL WITH GFR
ALT: 529 U/L — ABNORMAL HIGH (ref 0–44)
AST: 388 U/L — ABNORMAL HIGH (ref 15–41)
Albumin: 4.3 g/dL (ref 3.5–5.0)
Alkaline Phosphatase: 158 U/L — ABNORMAL HIGH (ref 38–126)
Anion gap: 10 (ref 5–15)
BUN: 21 mg/dL — ABNORMAL HIGH (ref 6–20)
CO2: 23 mmol/L (ref 22–32)
Calcium: 8.7 mg/dL — ABNORMAL LOW (ref 8.9–10.3)
Chloride: 100 mmol/L (ref 98–111)
Creatinine, Ser: 1.21 mg/dL (ref 0.61–1.24)
GFR, Estimated: >60 mL/min (ref >60)
Glucose, Bld: 122 mg/dL — ABNORMAL HIGH (ref 70–99)
Potassium: 3.2 mmol/L — ABNORMAL LOW (ref 3.5–5.1)
Sodium: 133 mmol/L — ABNORMAL LOW (ref 135–145)
Total Bilirubin: 0.9 mg/dL (ref 0.0–1.2)
Total Protein: 7.9 g/dL (ref 6.5–8.1)

## 2024-04-26 LAB — I-STAT CHEM 8, ED
BUN: 19 mg/dL (ref 6–20)
Calcium, Ion: 1.11 mmol/L — ABNORMAL LOW (ref 1.15–1.40)
Chloride: 102 mmol/L (ref 98–111)
Creatinine, Ser: 1.3 mg/dL — ABNORMAL HIGH (ref 0.61–1.24)
Glucose, Bld: 122 mg/dL — ABNORMAL HIGH (ref 70–99)
HCT: 43 % (ref 39.0–52.0)
Hemoglobin: 14.6 g/dL (ref 13.0–17.0)
Potassium: 3.3 mmol/L — ABNORMAL LOW (ref 3.5–5.1)
Sodium: 136 mmol/L (ref 135–145)
TCO2: 23 mmol/L (ref 22–32)

## 2024-04-26 LAB — URINALYSIS, W/ REFLEX TO CULTURE (INFECTION SUSPECTED)
Bilirubin Urine: NEGATIVE
Glucose, UA: NEGATIVE mg/dL
Hgb urine dipstick: NEGATIVE
Ketones, ur: NEGATIVE mg/dL
Leukocytes,Ua: NEGATIVE
Nitrite: NEGATIVE
Protein, ur: 30 mg/dL — AB
Specific Gravity, Urine: 1.012 (ref 1.005–1.030)
pH: 5 (ref 5.0–8.0)

## 2024-04-26 LAB — RAPID URINE DRUG SCREEN, HOSP PERFORMED
Amphetamines: POSITIVE — AB
Barbiturates: NOT DETECTED
Benzodiazepines: NOT DETECTED
Cocaine: POSITIVE — AB
Opiates: NOT DETECTED
Tetrahydrocannabinol: POSITIVE — AB

## 2024-04-26 LAB — ETHANOL: Alcohol, Ethyl (B): <15 mg/dL (ref <15)

## 2024-04-26 LAB — CK: Total CK: 640 U/L — ABNORMAL HIGH (ref 49–397)

## 2024-04-26 MED ORDER — SODIUM CHLORIDE 0.9 % IV BOLUS
2000.0000 mL | Freq: Once | INTRAVENOUS | Status: AC
  Administered 2024-04-26: 2000 mL via INTRAVENOUS

## 2024-04-26 MED ORDER — SODIUM CHLORIDE 0.9 % IV SOLN: INTRAVENOUS | Status: DC
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
## 2024-04-26 NOTE — ED Notes (Signed)
 Pt was walking around the hallway near his room after pulling out his IV and states " I'm ready to go back [facility]!"

## 2024-04-26 NOTE — ED Provider Notes (Addendum)
 [place] EMERGENCY DEPARTMENT AT [facility] Provider Note   CSN: [id number] Arrival date & time: 04/26/24  1521     History  Chief Complaint  Patient presents with   Altered Mental Status   Psychiatric Evaluation    William Macdonald is a 23 y.o. male.  23 year old male with history of polysubstance abuse presents due to increased agitation.  Patient was using cocaine as well as opiates.  Patient was agitated at the scene and would not follow commands.  Police were called and patient had to be restrained.  He was also given Haldol and Narcan .  At triage episode of hypotension which resolved quickly.  Given 600 cc by EMS and transported here.       Home Medications Prior to Admission medications   Medication Sig Start Date End Date Taking? Authorizing Provider  FLUoxetine  (PROZAC ) 20 MG capsule Take 1 capsule (20 mg total) by mouth daily. 04/19/24 04/19/25  Althia Atlas, MD  pantoprazole  (PROTONIX ) 40 MG tablet Take 1 tablet (40 mg total) by mouth daily for 7 days. 04/18/24 04/25/24  Althia Atlas, MD  polyethylene glycol powder (GLYCOLAX Verta Goring ) 17 GM/SCOOP powder Dissolve 17 g in liquid and take by mouth 2 (two) times daily. 04/18/24   Althia Atlas, MD  traZODone  (DESYREL ) 100 MG tablet Take 1 tablet (100 mg total) by mouth at bedtime as needed for sleep. 04/18/24   Althia Atlas, MD      Allergies    Zyprexa [olanzapine] and Trileptal [oxcarbazepine]    Review of Systems   Review of Systems  Unable to perform ROS: Acuity of condition    Physical Exam Updated Vital Signs BP 130/80 (BP Location: Right Arm)   Pulse (!) 126   Temp 98.9 F (37.2 C) (Oral)   Resp 18   Ht 1.727 m (5\' 8" )   Wt 65.7 kg   SpO2 98%   BMI 22.02 kg/m  Physical Exam Vitals and nursing note reviewed.  Constitutional:      General: He is not in acute distress.    Appearance: Normal appearance. He is well-developed. He is not toxic-appearing.  HENT:     Head: Normocephalic and  atraumatic.  Eyes:     General: Lids are normal.     Conjunctiva/sclera: Conjunctivae normal.     Pupils: Pupils are equal, round, and reactive to light.  Neck:     Thyroid: No thyroid mass.     Trachea: No tracheal deviation.  Cardiovascular:     Rate and Rhythm: Regular rhythm. Tachycardia present.     Heart sounds: Normal heart sounds. No murmur heard.    No gallop.  Pulmonary:     Effort: Pulmonary effort is normal. No respiratory distress.     Breath sounds: Normal breath sounds. No stridor. No decreased breath sounds, wheezing, rhonchi or rales.  Abdominal:     General: There is no distension.     Palpations: Abdomen is soft.     Tenderness: There is no abdominal tenderness. There is no rebound.  Musculoskeletal:        General: No tenderness. Normal range of motion.     Cervical back: Normal range of motion and neck supple.  Skin:    General: Skin is warm and dry.     Findings: No abrasion or rash.  Neurological:     Mental Status: He is oriented to person, place, and time. Mental status is at baseline. He is lethargic.     GCS: GCS eye subscore  is 4. GCS verbal subscore is 5. GCS motor subscore is 6.     Cranial Nerves: No cranial nerve deficit.     Sensory: No sensory deficit.     Motor: Motor function is intact.  Psychiatric:        Attention and Perception: He is inattentive.        Mood and Affect: Affect is blunt.        Speech: Speech is delayed.     ED Results / Procedures / Treatments   Labs (all labs ordered are listed, but only abnormal results are displayed) Labs Reviewed  CBC WITH DIFFERENTIAL/PLATELET - Abnormal; Notable for the following components:      Result Value   WBC 18.4 (*)    Neutro Abs 15.3 (*)    Monocytes Absolute 1.1 (*)    Abs Immature Granulocytes 0.13 (*)    All other components within normal limits  I-STAT CHEM 8, ED - Abnormal; Notable for the following components:   Potassium 3.3 (*)    Creatinine, Ser 1.30 (*)    Glucose,  Bld 122 (*)    Calcium, Ion 1.11 (*)    All other components within normal limits  COMPREHENSIVE METABOLIC PANEL WITH GFR  CK  URINALYSIS, W/ REFLEX TO CULTURE (INFECTION SUSPECTED)  ETHANOL  RAPID URINE DRUG SCREEN, HOSP PERFORMED    EKG EKG Interpretation Date/Time:  Wednesday April 26 2024 16:13:05 EDT Ventricular Rate:  103 PR Interval:  137 QRS Duration:  94 QT Interval:  348 QTC Calculation: 456 R Axis:   85  Text Interpretation: Sinus tachycardia Minimal ST depression, lateral leads Confirmed by Lind Repine ([id number]) on 04/26/2024 5:15:27 PM  Radiology No results found.  Procedures Procedures    Medications Ordered in ED Medications  0.9 %  sodium chloride  infusion (has no administration in time range)  sodium chloride  0.9 % bolus 2,000 mL (has no administration in time range)    ED Course/ Medical Decision Making/ A&P                                 Medical Decision Making Amount and/or Complexity of Data Reviewed Labs: ordered. Radiology: ordered. ECG/medicine tests: ordered.  Risk Prescription drug management.   Patient's EKG shows sinus tachycardia.  Given IV fluids here.  No elevation of his CK at 640.  Kidney function is normal.  Does have a transaminitis.  He has no abdominal pain or emesis at this time.  He has no bilirubin in his urine.  Patient hepatitis panel sent and he will need GI follow-up for this.  Patient monitored here and is now able to sit upright and eat without any difficulty.  Because patient had a history of head trauma CT head was informed and was negative for intracranial process.  Has history of ingesting AAA batteries and acute abdominal series shows that these have passed when compared to recent study.  Patient to be discharged with GI referral given  11:40 PM Patient at time of discharge so the nurse that he felt suicidal.  He was suicidal because he had no place to go.  I discussed with patient I will give him homeless  resources and that he states that now when he leaves here he will not harm himself or commit suicide.        Final Clinical Impression(s) / ED Diagnoses Final diagnoses:  None    Rx / DC Orders  ED Discharge Orders     None         Lind Repine, MD 04/26/24 2320    Lind Repine, MD 04/26/24 1610    Lind Repine, MD 04/26/24 2340

## 2024-04-26 NOTE — Discharge Instructions (Addendum)
 Your liver function tests were elevated here.  Hepatitis panel has been sent.  Call the gastroenterologist tomorrow to schedule a follow-up visit.

## 2024-04-26 NOTE — ED Notes (Signed)
 Went to DC patient and patient was asleep. Called out patients name and he woke up and turned over. Told patient he was discharged and he sat up and asked about shelter resources. I informed patient I'm not familiar with this area and was unsure of the locations. Patient then spoke up and stated he was "very suicidal" and that he didn't want to go back out there. That he would like to stay and receive rehabilitation services. I informed patient I would message the doctor and let him know. As walking out of the room the patient made it clear again by saying "Im very suicidal, not going back out there, I'm going to sleep now".

## 2024-04-26 NOTE — ED Notes (Signed)
 Dr allen and myself went into the patients room to talk to him. Dr Leighton Punches asked the patient specifically if he left if was going to harm himself and patient stated "No". Dr Leighton Punches spoke with patient about getting him resources for shelters and that was not a problem. Patient then asked about getting a ride there and I told the patient we could look into it but was unable to guarantee a ride at this hour. Patient agreed. Got the patient updated discharge papers with resources attached and dc patient.

## 2024-04-27 ENCOUNTER — Other Ambulatory Visit (HOSPITAL_COMMUNITY): Payer: Self-pay

## 2024-04-27 ENCOUNTER — Emergency Department (HOSPITAL_COMMUNITY)
Admission: EM | Admit: 2024-04-27 | Discharge: 2024-04-27 | Disposition: A | Discharge status: Home / Self Care | Attending: Emergency Medicine | Admitting: Emergency Medicine

## 2024-04-27 DIAGNOSIS — R21 Rash and other nonspecific skin eruption: Secondary | ICD-10-CM | POA: Insufficient documentation

## 2024-04-27 LAB — HEPATITIS PANEL, ACUTE
HCV Ab: REACTIVE — AB
Hepatitis B Surface Ag: NONREACTIVE

## 2024-04-27 MED ORDER — VALACYCLOVIR HCL 1 G PO TABS
1000.0000 mg | ORAL_TABLET | Freq: Three times a day (TID) | ORAL | 0 refills | Status: AC
  Filled 2024-04-27: qty 21, 7d supply, fill #0

## 2024-04-27 NOTE — Discharge Instructions (Signed)
 I think your rash could be due to the sun.  Try to avoid sun exposure.  Wear hat if you can.  Sunscreen.

## 2024-04-27 NOTE — ED Provider Notes (Signed)
 [place] EMERGENCY DEPARTMENT AT [facility] Provider Note   CSN: [id number] Arrival date & time: 04/27/24  4098     History  Chief Complaint  Patient presents with   Rash    William Macdonald is a 23 y.o. male.  23 yo M with a chief complaints of a rash.  Patient is worried that he has herpes.  He would like treatment for the same.  He denies any genital rash.  States on the side of his face the back of his neck and to his lips.   Rash      Home Medications Prior to Admission medications   Medication Sig Start Date End Date Taking? Authorizing Provider  valACYclovir  (VALTREX ) 1000 MG tablet Take 1 tablet (1,000 mg total) by mouth 3 (three) times daily. 04/27/24  Yes Albertus Hughs, DO  FLUoxetine  (PROZAC ) 20 MG capsule Take 1 capsule (20 mg total) by mouth daily. 04/19/24 04/19/25  Althia Atlas, MD  pantoprazole  (PROTONIX ) 40 MG tablet Take 1 tablet (40 mg total) by mouth daily for 7 days. 04/18/24 04/25/24  Althia Atlas, MD  polyethylene glycol powder (GLYCOLAX /MIRALAX ) 17 GM/SCOOP powder Dissolve 17 g in liquid and take by mouth 2 (two) times daily. 04/18/24   Althia Atlas, MD  traZODone  (DESYREL ) 100 MG tablet Take 1 tablet (100 mg total) by mouth at bedtime as needed for sleep. 04/18/24   Althia Atlas, MD      Allergies    Zyprexa [olanzapine] and Trileptal [oxcarbazepine]    Review of Systems   Review of Systems  Skin:  Positive for rash.    Physical Exam Updated Vital Signs BP 122/79 (BP Location: Left Arm)   Pulse 80   Temp 98.3 F (36.8 C) (Oral)   Resp 16   SpO2 98%  Physical Exam Vitals and nursing note reviewed.  Constitutional:      Appearance: He is well-developed.  HENT:     Head: Normocephalic and atraumatic.     Comments: Diffuse areas of erythema mostly consistent with sunburn.  There is a papular rash is noted to the side of the face and the base of the neck.  More consistent with comedone. Eyes:     Pupils: Pupils are equal, round,  and reactive to light.  Neck:     Vascular: No JVD.  Cardiovascular:     Rate and Rhythm: Normal rate and regular rhythm.     Heart sounds: No murmur heard.    No friction rub. No gallop.  Pulmonary:     Effort: No respiratory distress.     Breath sounds: No wheezing.  Abdominal:     General: There is no distension.     Tenderness: There is no abdominal tenderness. There is no guarding or rebound.  Musculoskeletal:        General: Normal range of motion.     Cervical back: Normal range of motion and neck supple.  Skin:    Coloration: Skin is not pale.     Findings: No rash.  Neurological:     Mental Status: He is alert and oriented to person, place, and time.  Psychiatric:        Behavior: Behavior normal.     ED Results / Procedures / Treatments   Labs (all labs ordered are listed, but only abnormal results are displayed) Labs Reviewed - No data to display  EKG None  Radiology CT Head Wo Contrast Result Date: 04/26/2024 EXAM: CT HEAD WITHOUT 04/26/2024 08:03:58 PM  TECHNIQUE: CT of the head was performed without the administration of intravenous contrast. Automated exposure control, iterative reconstruction, and/or weight based adjustment of the mA/kV was utilized to reduce the radiation dose to as low as reasonably achievable. COMPARISON: 10/12/2023 CLINICAL HISTORY: Head trauma, abnormal mental status (Age 33-64y). FINDINGS: BRAIN AND VENTRICLES: There is no acute intracranial hemorrhage, mass effect or midline shift. No abnormal extra-axial fluid collection. The gray-white differentiation is maintained without evidence of an acute infarct. There is no evidence of hydrocephalus. ORBITS: The visualized portion of the orbits demonstrate no acute abnormality. SINUSES: The visualized paranasal sinuses and mastoid air cells demonstrate no acute abnormality. SOFT TISSUES AND SKULL: No acute abnormality of the visualized skull or soft tissues. IMPRESSION: 1. No acute intracranial  abnormality. Electronically signed by: Zadie Herter MD 04/26/2024 08:10 PM EDT RP Workstation: [id number]   DG ABD ACUTE 2+V W 1V CHEST Result Date: 04/26/2024 CLINICAL DATA:  Illicit drug abuse, hit head, hypotension, previous foreign body ingestion EXAM: DG ABDOMEN ACUTE WITH 1 VIEW CHEST COMPARISON:  04/18/2024 FINDINGS: Supine and upright frontal views of the abdomen and pelvis as well as an upright frontal view of the chest are obtained. Cardiac silhouette is unremarkable. No airspace disease, effusion, or pneumothorax. No acute bony abnormalities. Unremarkable bowel gas pattern. No obstruction or ileus. The ingested batteries seen on previous study have either passed or been removed in the interim. No evidence of ingested radiopaque foreign body on this study. No free gas within the greater peritoneal sac. No masses or abnormal calcifications. IMPRESSION: 1. No acute intrathoracic process. 2. Unremarkable bowel gas pattern. 3. Interval passage or removal of the ingested foreign bodies seen previously. No radiopaque foreign bodies on this exam. Electronically Signed   By: Bobbye Burrow M.D.   On: 04/26/2024 17:18    Procedures Procedures    Medications Ordered in ED Medications - No data to display  ED Course/ Medical Decision Making/ A&P                                 Medical Decision Making Risk Prescription drug management.   23 yo M with a cc of a rash.  The patient is concerned that he has herpes.  Clinically the patient does not have herpes.  It looks more like sunburn and acne.  He is adamant that this is herpes and would like a prescription for valacyclovir .  I see little downside to this prescription.  Encouraged him to follow-up with his family doctor in the office.  I did talk with him about avoiding sun exposure and wearing sunscreen or hat if able.  8:40 AM:  I have discussed the diagnosis/risks/treatment options with the patient.  Evaluation and diagnostic testing  in the emergency department does not suggest an emergent condition requiring admission or immediate intervention beyond what has been performed at this time.  They will follow up with PCP. We also discussed returning to the ED immediately if new or worsening sx occur. We discussed the sx which are most concerning (e.g., sudden worsening pain, fever, inability to tolerate by mouth) that necessitate immediate return. Medications administered to the patient during their visit and any new prescriptions provided to the patient are listed below.  Medications given during this visit Medications - No data to display   The patient appears reasonably screen and/or stabilized for discharge and I doubt any other medical condition or other [facility] requiring further screening,  evaluation, or treatment in the ED at this time prior to discharge.          Final Clinical Impression(s) / ED Diagnoses Final diagnoses:  Rash    Rx / DC Orders ED Discharge Orders          Ordered    valACYclovir  (VALTREX ) 1000 MG tablet  3 times daily        04/27/24 0830              Albertus Hughs, DO 04/27/24 0840

## 2024-04-27 NOTE — ED Triage Notes (Signed)
 Pt c/o rash which they believe to be herpes on face and head for 3x days. No blisters noted.

## 2024-04-28 ENCOUNTER — Emergency Department (HOSPITAL_COMMUNITY)
Admission: EM | Admit: 2024-04-28 | Discharge: 2024-04-28 | Disposition: A | Discharge status: Home / Self Care | Attending: Emergency Medicine | Admitting: Emergency Medicine

## 2024-04-28 ENCOUNTER — Other Ambulatory Visit: Payer: Self-pay

## 2024-04-28 DIAGNOSIS — Z5900 Homelessness unspecified: Secondary | ICD-10-CM | POA: Insufficient documentation

## 2024-04-28 DIAGNOSIS — R21 Rash and other nonspecific skin eruption: Secondary | ICD-10-CM | POA: Insufficient documentation

## 2024-04-28 MED ORDER — CEPHALEXIN 500 MG PO CAPS: 500.0000 mg | ORAL_CAPSULE | Freq: Four times a day (QID) | ORAL | 0 refills | Status: AC

## 2024-04-28 MED ORDER — HYDROXYZINE HCL 25 MG PO TABS: 25.0000 mg | ORAL_TABLET | Freq: Four times a day (QID) | ORAL | 0 refills | Status: AC

## 2024-04-28 MED ORDER — TRIAMCINOLONE ACETONIDE 0.5 % EX OINT: 1.0000 | TOPICAL_OINTMENT | Freq: Two times a day (BID) | CUTANEOUS | 0 refills | Status: AC

## 2024-04-28 NOTE — ED Provider Notes (Signed)
 [place] EMERGENCY DEPARTMENT AT [facility] Provider Note   CSN: [id number] Arrival date & time: 04/28/24  0645     History  Chief Complaint  Patient presents with   Rash    William Macdonald is a 23 y.o. male with past medical history significant for PTSD, polysubstance abuse, homelessness, psychosis who presents concern for persistent generalized skin rash for several days.  He was seen yesterday by another provider with concern at that time that he had sunburn, and some skin irritation secondary to his homelessness, heat exposure.  He reports no change since starting taking Valtrex  yesterday.  He reports he thinks it is irritating his stomach.   Rash      Home Medications Prior to Admission medications   Medication Sig Start Date End Date Taking? Authorizing Provider  cephALEXin  (KEFLEX ) 500 MG capsule Take 1 capsule (500 mg total) by mouth 4 (four) times daily. 04/28/24  Yes Radonna Bracher H, PA-C  hydrOXYzine (ATARAX) 25 MG tablet Take 1 tablet (25 mg total) by mouth every 6 (six) hours. 04/28/24  Yes Ariyanah Aguado H, PA-C  triamcinolone ointment (KENALOG) 0.5 % Apply 1 Application topically 2 (two) times daily. 04/28/24  Yes Austin Pongratz H, PA-C  FLUoxetine  (PROZAC ) 20 MG capsule Take 1 capsule (20 mg total) by mouth daily. 04/19/24 04/19/25  Althia Atlas, MD  pantoprazole  (PROTONIX ) 40 MG tablet Take 1 tablet (40 mg total) by mouth daily for 7 days. 04/18/24 04/25/24  Althia Atlas, MD  polyethylene glycol powder (GLYCOLAX /MIRALAX ) 17 GM/SCOOP powder Dissolve 17 g in liquid and take by mouth 2 (two) times daily. 04/18/24   Althia Atlas, MD  traZODone  (DESYREL ) 100 MG tablet Take 1 tablet (100 mg total) by mouth at bedtime as needed for sleep. 04/18/24   Althia Atlas, MD  valACYclovir  (VALTREX ) 1000 MG tablet Take 1 tablet (1,000 mg total) by mouth 3 (three) times daily. 04/27/24   Albertus Hughs, DO      Allergies    Zyprexa [olanzapine] and Trileptal  [oxcarbazepine]    Review of Systems   Review of Systems  Skin:  Positive for rash.  All other systems reviewed and are negative.   Physical Exam Updated Vital Signs BP 135/88 (BP Location: Right Arm)   Pulse 94   Temp (!) 97.5 F (36.4 C)   Resp 16   SpO2 99%  Physical Exam Vitals and nursing note reviewed.  Constitutional:      General: He is not in acute distress.    Appearance: Normal appearance.  HENT:     Head: Normocephalic and atraumatic.  Eyes:     General:        Right eye: No discharge.        Left eye: No discharge.  Cardiovascular:     Rate and Rhythm: Normal rate and regular rhythm.  Pulmonary:     Effort: Pulmonary effort is normal. No respiratory distress.  Musculoskeletal:        General: No deformity.  Skin:    General: Skin is warm and dry.     Comments: Diffuse areas of erythema mostly consistent with sunburn.  There is a papular rash is noted to the side of the face and the base of the neck.  More consistent with comedone.  Neurological:     Mental Status: He is alert and oriented to person, place, and time.  Psychiatric:        Mood and Affect: Mood normal.  Behavior: Behavior normal.     ED Results / Procedures / Treatments   Labs (all labs ordered are listed, but only abnormal results are displayed) Labs Reviewed - No data to display  EKG None  Radiology CT Head Wo Contrast Result Date: 04/26/2024 EXAM: CT HEAD WITHOUT 04/26/2024 08:03:58 PM TECHNIQUE: CT of the head was performed without the administration of intravenous contrast. Automated exposure control, iterative reconstruction, and/or weight based adjustment of the mA/kV was utilized to reduce the radiation dose to as low as reasonably achievable. COMPARISON: 10/12/2023 CLINICAL HISTORY: Head trauma, abnormal mental status (Age 38-64y). FINDINGS: BRAIN AND VENTRICLES: There is no acute intracranial hemorrhage, mass effect or midline shift. No abnormal extra-axial fluid  collection. The gray-white differentiation is maintained without evidence of an acute infarct. There is no evidence of hydrocephalus. ORBITS: The visualized portion of the orbits demonstrate no acute abnormality. SINUSES: The visualized paranasal sinuses and mastoid air cells demonstrate no acute abnormality. SOFT TISSUES AND SKULL: No acute abnormality of the visualized skull or soft tissues. IMPRESSION: 1. No acute intracranial abnormality. Electronically signed by: Zadie Herter MD 04/26/2024 08:10 PM EDT RP Workstation: [id number]   DG ABD ACUTE 2+V W 1V CHEST Result Date: 04/26/2024 CLINICAL DATA:  Illicit drug abuse, hit head, hypotension, previous foreign body ingestion EXAM: DG ABDOMEN ACUTE WITH 1 VIEW CHEST COMPARISON:  04/18/2024 FINDINGS: Supine and upright frontal views of the abdomen and pelvis as well as an upright frontal view of the chest are obtained. Cardiac silhouette is unremarkable. No airspace disease, effusion, or pneumothorax. No acute bony abnormalities. Unremarkable bowel gas pattern. No obstruction or ileus. The ingested batteries seen on previous study have either passed or been removed in the interim. No evidence of ingested radiopaque foreign body on this study. No free gas within the greater peritoneal sac. No masses or abnormal calcifications. IMPRESSION: 1. No acute intrathoracic process. 2. Unremarkable bowel gas pattern. 3. Interval passage or removal of the ingested foreign bodies seen previously. No radiopaque foreign bodies on this exam. Electronically Signed   By: Bobbye Burrow M.D.   On: 04/26/2024 17:18    Procedures Procedures    Medications Ordered in ED Medications - No data to display  ED Course/ Medical Decision Making/ A&P                                 Medical Decision Making  Patient who is currently unhoused with ongoing generalized rash. My emergent ddx includes sunburn, heat rash, comedonal rash, cellulitis, erysipelas, vs other.  I agree  with previous provider assessment that this does not look like a herpes rash, overall with low clinical suspicion for cellulitis, erysipelas, no evidence of fluctuance, no purulent drainage.  Overall with high clinical suspicion for combination of sunburn, heat rash, and comedonal skin irritation likely from wearing dirty close in a hot sweaty environment for the last several days.  Discussed I think reasonable to cover with Keflex  for any possible developing infection as he does report shaving his scalp with a used razor, provided triamcinolone for areas of itching, discomfort, and Atarax to help with itching sensation.  Patient reassured and encouraged to avoid sun exposure. Final Clinical Impression(s) / ED Diagnoses Final diagnoses:  Rash    Rx / DC Orders ED Discharge Orders          Ordered    cephALEXin  (KEFLEX ) 500 MG capsule  4 times daily  04/28/24 0816    triamcinolone ointment (KENALOG) 0.5 %  2 times daily        04/28/24 0816    hydrOXYzine (ATARAX) 25 MG tablet  Every 6 hours        04/28/24 0816              Nelly Banco, PA-C 04/28/24 6045    Dorenda Gandy, MD 04/29/24 [phone]

## 2024-04-28 NOTE — Discharge Instructions (Addendum)
 As we discussed I think that you have a combination of some sunburn, skin irritation, acne from heat, sweat, sun exposure, I do not see any evidence of a parasitic infection, bug infection, or herpes.  You can apply the topical steroid up to twice daily to the areas of irritated skin, you can take the Atarax (hydroxyzine) for itching, you can use the Keflex  to cover for any possible developing skin infection on your scalp where you shaved with a dirty razor although I do not see any evidence of current infection at this time.  You can stop taking the Valtrex , I do not see any evidence of herpes infection.

## 2024-04-28 NOTE — ED Notes (Signed)
 Pt given [place] sandwich and soda.

## 2024-04-28 NOTE — ED Triage Notes (Signed)
 Patient reports persistent generalized skin rashes for several days currently taking Valacyclovir  prescribed here this week with no improvement .
